# Patient Record
Sex: Female | Born: 1985 | Race: Black or African American | Hispanic: No | Marital: Married | Smoking: Current every day smoker
Health system: Southern US, Community
[De-identification: ages and names within clinical notes are randomized; demographics above are authoritative.]

## PROBLEM LIST (undated history)

## (undated) DIAGNOSIS — D649 Anemia, unspecified: Secondary | ICD-10-CM

## (undated) HISTORY — PX: HERNIA REPAIR: SHX51

---

## 2010-02-15 ENCOUNTER — Emergency Department (HOSPITAL_COMMUNITY): Admission: EM | Admit: 2010-02-15 | Discharge: 2010-02-16 | Payer: Self-pay | Admitting: Emergency Medicine

## 2011-01-30 LAB — URINALYSIS, ROUTINE W REFLEX MICROSCOPIC
Hgb urine dipstick: NEGATIVE
Protein, ur: NEGATIVE mg/dL
Urobilinogen, UA: 1 mg/dL (ref 0.0–1.0)

## 2011-10-16 ENCOUNTER — Encounter: Payer: Self-pay | Admitting: *Deleted

## 2011-10-16 ENCOUNTER — Emergency Department (HOSPITAL_COMMUNITY)
Admission: EM | Admit: 2011-10-16 | Discharge: 2011-10-16 | Disposition: A | Discharge status: Home / Self Care | Payer: Self-pay | Attending: Emergency Medicine | Admitting: Emergency Medicine

## 2011-10-16 DIAGNOSIS — R109 Unspecified abdominal pain: Secondary | ICD-10-CM | POA: Insufficient documentation

## 2011-10-16 DIAGNOSIS — R300 Dysuria: Secondary | ICD-10-CM | POA: Insufficient documentation

## 2011-10-16 DIAGNOSIS — M549 Dorsalgia, unspecified: Secondary | ICD-10-CM | POA: Insufficient documentation

## 2011-10-16 DIAGNOSIS — N390 Urinary tract infection, site not specified: Secondary | ICD-10-CM | POA: Insufficient documentation

## 2011-10-16 DIAGNOSIS — R6883 Chills (without fever): Secondary | ICD-10-CM | POA: Insufficient documentation

## 2011-10-16 LAB — URINALYSIS, ROUTINE W REFLEX MICROSCOPIC
Glucose, UA: NEGATIVE mg/dL
Hgb urine dipstick: NEGATIVE
Specific Gravity, Urine: 1.023 (ref 1.005–1.030)
pH: 7.5 (ref 5.0–8.0)

## 2011-10-16 LAB — POCT PREGNANCY, URINE: Preg Test, Ur: NEGATIVE

## 2011-10-16 LAB — URINE MICROSCOPIC-ADD ON

## 2011-10-16 MED ORDER — CEPHALEXIN 500 MG PO CAPS: 500.0000 mg | ORAL_CAPSULE | Freq: Four times a day (QID) | ORAL | Status: AC

## 2011-10-16 MED ORDER — IBUPROFEN 800 MG PO TABS
800.0000 mg | ORAL_TABLET | Freq: Once | ORAL | Status: AC
  Administered 2011-10-16: 800 mg via ORAL
  Filled 2011-10-16: qty 1

## 2011-10-16 MED ORDER — CEFTRIAXONE SODIUM 1 G IJ SOLR
1.0000 g | Freq: Once | INTRAMUSCULAR | Status: AC
  Administered 2011-10-16: 1 g via INTRAMUSCULAR
  Filled 2011-10-16: qty 10

## 2011-10-16 MED ORDER — HYDROCODONE-ACETAMINOPHEN 5-325 MG PO TABS: 1.0000 | ORAL_TABLET | Freq: Four times a day (QID) | ORAL | Status: AC | PRN

## 2011-10-16 MED ORDER — NAPROXEN 500 MG PO TABS: 500.0000 mg | ORAL_TABLET | Freq: Two times a day (BID) | ORAL | Status: DC

## 2011-10-16 MED ORDER — LIDOCAINE HCL (PF) 1 % IJ SOLN
INTRAMUSCULAR | Status: AC
  Administered 2011-10-16: 5 mL via INTRAMUSCULAR
  Filled 2011-10-16: qty 5

## 2011-10-16 NOTE — ED Notes (Signed)
Also having abd pain this am.

## 2011-10-16 NOTE — ED Provider Notes (Signed)
History     CSN: [id number] Arrival date & time: 10/16/2011  9:26 AM   First MD Initiated Contact with Patient 10/16/11 1124      Chief Complaint  Patient presents with  . Back Pain  . Urinary Tract Infection    (Consider location/radiation/quality/duration/timing/severity/associated sxs/prior treatment) The history is provided by the patient.   Patient is a 25 year old female with complaint of dysuria since Sunday that would be 3 days ago onset of low back pain 2 days ago with chills no nausea no vomiting pain is a 10 out of 10 at worse is nonradiating she also has suprapubic discomfort postvoid. No history of urinary tract infection or back problems in the past. No upper respiratory symptoms.  Pain is described as dull and sharp.     History reviewed. No pertinent past medical history.  History reviewed. No pertinent past surgical history.  History reviewed. No pertinent family history.  History  Substance Use Topics  . Smoking status: Never Smoker   . Smokeless tobacco: Not on file  . Alcohol Use: No    OB History    Grav Para Term Preterm Abortions TAB SAB Ect Mult Living                  Review of Systems  Constitutional: Positive for chills. Negative for fever and diaphoresis.  HENT: Negative for congestion, sore throat and neck pain.   Respiratory: Negative for cough, chest tightness and shortness of breath.   Cardiovascular: Negative for chest pain and leg swelling.  Gastrointestinal: Positive for abdominal pain. Negative for nausea, vomiting and diarrhea.  Genitourinary: Positive for dysuria. Negative for urgency, frequency, hematuria and vaginal discharge.  Musculoskeletal: Positive for back pain.  Skin: Negative for rash.  Neurological: Negative for headaches.  Hematological: Does not bruise/bleed easily.    Allergies  Review of patient's allergies indicates no known allergies.  Home Medications   Current Outpatient Rx  Name Route Sig Dispense  Refill  . NAPROXEN SODIUM 220 MG PO TABS Oral Take 220 mg by mouth 2 (two) times daily as needed. For pain       BP 104/67  Pulse 69  Temp(Src) 98.1 F (36.7 C) (Oral)  Resp 18  SpO2 100%  LMP 10/02/2011  Physical Exam  Nursing note and vitals reviewed. Constitutional: She is oriented to person, place, and time. She appears well-developed and well-nourished. No distress.  HENT:  Head: Normocephalic and atraumatic.  Mouth/Throat: Oropharynx is clear and moist.  Eyes: Conjunctivae and EOM are normal. Pupils are equal, round, and reactive to light.  Neck: Normal range of motion. Neck supple.  Cardiovascular: Normal rate, regular rhythm and normal heart sounds.   No murmur heard. Pulmonary/Chest: Effort normal and breath sounds normal.  Abdominal: Soft. Bowel sounds are normal.  Musculoskeletal: Normal range of motion. She exhibits no edema and no tenderness.  Neurological: She is alert and oriented to person, place, and time. No cranial nerve deficit. She exhibits normal muscle tone. Coordination normal.  Skin: Skin is warm. No rash noted. She is not diaphoretic.    ED Course  Procedures (including critical care time)  Labs Reviewed  URINALYSIS, ROUTINE W REFLEX MICROSCOPIC - Abnormal; Notable for the following:    APPearance CLOUDY (*)    Nitrite POSITIVE (*)    Leukocytes, UA TRACE (*)    All other components within normal limits  URINE MICROSCOPIC-ADD ON - Abnormal; Notable for the following:    Squamous Epithelial / LPF FEW (*)  Bacteria, UA MANY (*)    All other components within normal limits  POCT PREGNANCY, URINE  POCT PREGNANCY, URINE   No results found.   1. Urinary tract infection       MDM   Patient's symptoms consistent with urinary tract infection, urinalysis not is clear but does have positive nitrite, many bacteria questionable contamination but in the face of a positive nitrite we'll treat for urinary tract infection. In the emergency department  patient given of 1 g of IM Rocephin, in case early pyelonephritis due to her back pain and chills. She definitely does give a history of to sure he since Sunday. Suspect at least a component of a urinary tract infect.        Shelda Jakes, MD 10/16/11 [phone]

## 2011-10-16 NOTE — ED Notes (Signed)
Reports shooting pain to mid and lower back pain x 2 days, pain with urination.

## 2011-11-04 ENCOUNTER — Encounter (HOSPITAL_COMMUNITY): Payer: Self-pay | Admitting: *Deleted

## 2011-11-04 ENCOUNTER — Emergency Department (HOSPITAL_COMMUNITY)
Admission: EM | Admit: 2011-11-04 | Discharge: 2011-11-04 | Disposition: A | Discharge status: Home / Self Care | Payer: Self-pay | Attending: Emergency Medicine | Admitting: Emergency Medicine

## 2011-11-04 DIAGNOSIS — R5381 Other malaise: Secondary | ICD-10-CM | POA: Insufficient documentation

## 2011-11-04 DIAGNOSIS — D649 Anemia, unspecified: Secondary | ICD-10-CM | POA: Insufficient documentation

## 2011-11-04 DIAGNOSIS — R319 Hematuria, unspecified: Secondary | ICD-10-CM | POA: Insufficient documentation

## 2011-11-04 DIAGNOSIS — N898 Other specified noninflammatory disorders of vagina: Secondary | ICD-10-CM | POA: Insufficient documentation

## 2011-11-04 DIAGNOSIS — N939 Abnormal uterine and vaginal bleeding, unspecified: Secondary | ICD-10-CM

## 2011-11-04 LAB — BASIC METABOLIC PANEL
CO2: 25 mEq/L (ref 19–32)
Calcium: 9.4 mg/dL (ref 8.4–10.5)
Creatinine, Ser: 0.6 mg/dL (ref 0.50–1.10)
GFR calc non Af Amer: >90 mL/min (ref >90)
Glucose, Bld: 89 mg/dL (ref 70–99)
Sodium: 141 mEq/L (ref 135–145)

## 2011-11-04 LAB — DIFFERENTIAL
Basophils Absolute: 0 10*3/uL (ref 0.0–0.1)
Basophils Relative: 1 % (ref 0–1)
Eosinophils Absolute: 0.1 10*3/uL (ref 0.0–0.7)
Monocytes Absolute: 0.4 10*3/uL (ref 0.1–1.0)
Neutro Abs: 2.6 10*3/uL (ref 1.7–7.7)
Neutrophils Relative %: 47 % (ref 43–77)

## 2011-11-04 LAB — CBC
MCH: 20.2 pg — ABNORMAL LOW (ref 26.0–34.0)
MCHC: 29.5 g/dL — ABNORMAL LOW (ref 30.0–36.0)
RDW: 17.1 % — ABNORMAL HIGH (ref 11.5–15.5)

## 2011-11-04 LAB — URINALYSIS, ROUTINE W REFLEX MICROSCOPIC
Bilirubin Urine: NEGATIVE
Glucose, UA: NEGATIVE mg/dL
Ketones, ur: NEGATIVE mg/dL
Protein, ur: 30 mg/dL — AB
Urobilinogen, UA: 1 mg/dL (ref 0.0–1.0)

## 2011-11-04 LAB — HCG, QUANTITATIVE, PREGNANCY: hCG, Beta Chain, Quant, S: <1 m[IU]/mL (ref <5)

## 2011-11-04 LAB — WET PREP, GENITAL: Clue Cells Wet Prep HPF POC: NONE SEEN

## 2011-11-04 LAB — URINE MICROSCOPIC-ADD ON

## 2011-11-04 LAB — PREGNANCY, URINE: Preg Test, Ur: NEGATIVE

## 2011-11-04 LAB — PROTIME-INR: INR: 1.12 (ref 0.00–1.49)

## 2011-11-04 MED ORDER — FERROUS SULFATE 325 (65 FE) MG PO TABS: 325.0000 mg | ORAL_TABLET | Freq: Every day | ORAL | Status: DC

## 2011-11-04 NOTE — ED Notes (Signed)
To ed for eval of heavy vaginal bleeding. Started yesterday. States she has passed large clots

## 2011-11-04 NOTE — ED Provider Notes (Signed)
History     CSN: [id number]  Arrival date & time 11/04/11  1334   First MD Initiated Contact with Patient 11/04/11 1414      Chief Complaint  Patient presents with  . Vaginal Bleeding    (Consider location/radiation/quality/duration/timing/severity/associated sxs/prior treatment) HPI Comments: Patient presents with vaginal bleeding started last night. She noticed dark red blood with 2 large clots last night. This morning she woke up there was a lot of blood she passed a large clot. Her last normal period was November 20. She is not have any pain with the bleeding prior to the bleeding she had cramps. She denies any nausea, vomiting, chest pain, shortness of breath, lightheadedness. She does endorse denies weakness. She does have a history of anemia and is supposed and iron which is not take.  The history is provided by the patient.    History reviewed. No pertinent past medical history.  History reviewed. No pertinent past surgical history.  History reviewed. No pertinent family history.  History  Substance Use Topics  . Smoking status: Never Smoker   . Smokeless tobacco: Not on file  . Alcohol Use: No    OB History    Grav Para Term Preterm Abortions TAB SAB Ect Mult Living                  Review of Systems  Constitutional: Positive for fatigue.  HENT: Negative for congestion and rhinorrhea.   Respiratory: Negative for cough and shortness of breath.   Cardiovascular: Negative for chest pain.  Gastrointestinal: Negative for nausea, vomiting and abdominal pain.  Genitourinary: Positive for hematuria and vaginal bleeding. Negative for pelvic pain.  Musculoskeletal: Negative for back pain.  Skin: Negative for rash.  Neurological: Positive for weakness. Negative for dizziness and headaches.    Allergies  Review of patient's allergies indicates no known allergies.  Home Medications   Current Outpatient Rx  Name Route Sig Dispense Refill  . FERROUS SULFATE 325  (65 FE) MG PO TABS Oral Take 1 tablet (325 mg total) by mouth daily. 30 tablet 0    BP 114/71  Pulse 89  Temp(Src) 97 F (36.1 C) (Oral)  Resp 17  SpO2 99%  LMP 10/02/2011  Physical Exam  Constitutional: She is oriented to person, place, and time. She appears well-developed and well-nourished. No distress.  HENT:  Head: Normocephalic and atraumatic.  Mouth/Throat: Oropharynx is clear and moist. No oropharyngeal exudate.  Eyes: Conjunctivae are normal. Pupils are equal, round, and reactive to light.  Neck: Normal range of motion. Neck supple.  Cardiovascular: Normal rate, regular rhythm and normal heart sounds.   Pulmonary/Chest: Effort normal and breath sounds normal. No respiratory distress.  Abdominal: Soft. There is no tenderness. There is no rebound and no guarding.  Genitourinary: Vagina normal. There is no tenderness on the right labia. There is no tenderness on the left labia. Cervix exhibits no motion tenderness. Right adnexum displays no tenderness. Left adnexum displays no tenderness.       Dark red blood in vaginal vault, cervix normal with no active bleeding  Musculoskeletal: Normal range of motion. She exhibits no edema and no tenderness.  Neurological: She is alert and oriented to person, place, and time. No cranial nerve deficit.  Skin: Skin is warm.    ED Course  Procedures (including critical care time)  Labs Reviewed  CBC - Abnormal; Notable for the following:    RBC 3.72 (*)    Hemoglobin 7.5 (*)    HCT  25.4 (*)    MCV 68.3 (*)    MCH 20.2 (*)    MCHC 29.5 (*)    RDW 17.1 (*)    All other components within normal limits  URINALYSIS, ROUTINE W REFLEX MICROSCOPIC - Abnormal; Notable for the following:    Hgb urine dipstick LARGE (*)    Protein, ur 30 (*)    All other components within normal limits  WET PREP, GENITAL - Abnormal; Notable for the following:    WBC, Wet Prep HPF POC FEW (*)    All other components within normal limits  URINE  MICROSCOPIC-ADD ON - Abnormal; Notable for the following:    Squamous Epithelial / LPF MANY (*)    Bacteria, UA FEW (*)    All other components within normal limits  DIFFERENTIAL  PROTIME-INR  BASIC METABOLIC PANEL  PREGNANCY, URINE  HCG, QUANTITATIVE, PREGNANCY  GC/CHLAMYDIA PROBE AMP, GENITAL   No results found.   1. Vaginal bleeding   2. Anemia       MDM  Vaginal bleeding with clots, history of anemia. Vital signs stable patient no distress. Abdomen soft. No active bleeding on exam.  Hemoglobin 7.5 without a previous studies available.  Orthostatic vitals negative.  She is in no distress and having no pain.  No active vaginal bleeding, patient is not pregnant.  Hemoglobin is 7.5 without any previous values available.  Patient does not know her baseline.  We'll start patient back on iron and have followup with women's clinic. She is aware of her anemia.       Glynn Octave, MD 11/04/11 1728

## 2011-11-06 LAB — GC/CHLAMYDIA PROBE AMP, GENITAL
Chlamydia, DNA Probe: NEGATIVE
GC Probe Amp, Genital: NEGATIVE

## 2012-03-26 ENCOUNTER — Inpatient Hospital Stay (HOSPITAL_COMMUNITY)
Admission: AD | Admit: 2012-03-26 | Discharge: 2012-03-26 | Disposition: A | Discharge status: Home / Self Care | Payer: Self-pay | Source: Ambulatory Visit | Attending: Obstetrics & Gynecology | Admitting: Obstetrics & Gynecology

## 2012-03-26 ENCOUNTER — Inpatient Hospital Stay (HOSPITAL_COMMUNITY)
Admission: AD | Admit: 2012-03-26 | Discharge: 2012-03-26 | Disposition: A | Discharge status: Home / Self Care | Payer: Self-pay | Source: Ambulatory Visit | Attending: Obstetrics and Gynecology | Admitting: Obstetrics and Gynecology

## 2012-03-26 DIAGNOSIS — R42 Dizziness and giddiness: Secondary | ICD-10-CM | POA: Insufficient documentation

## 2012-03-26 DIAGNOSIS — R51 Headache: Secondary | ICD-10-CM | POA: Insufficient documentation

## 2012-03-26 DIAGNOSIS — Z5320 Procedure and treatment not carried out because of patient's decision for unspecified reasons: Secondary | ICD-10-CM | POA: Insufficient documentation

## 2012-03-26 DIAGNOSIS — D649 Anemia, unspecified: Secondary | ICD-10-CM | POA: Insufficient documentation

## 2012-03-26 LAB — COMPREHENSIVE METABOLIC PANEL
Albumin: 3.5 g/dL (ref 3.5–5.2)
BUN: 7 mg/dL (ref 6–23)
Calcium: 9.2 mg/dL (ref 8.4–10.5)
Creatinine, Ser: 0.59 mg/dL (ref 0.50–1.10)
Total Protein: 6.7 g/dL (ref 6.0–8.3)

## 2012-03-26 LAB — CBC
HCT: 29.3 % — ABNORMAL LOW (ref 36.0–46.0)
MCHC: 28.7 g/dL — ABNORMAL LOW (ref 30.0–36.0)
MCV: 69.9 fL — ABNORMAL LOW (ref 78.0–100.0)
RDW: 17.4 % — ABNORMAL HIGH (ref 11.5–15.5)

## 2012-03-26 LAB — URINALYSIS, ROUTINE W REFLEX MICROSCOPIC
Bilirubin Urine: NEGATIVE
Glucose, UA: NEGATIVE mg/dL
Hgb urine dipstick: NEGATIVE
Specific Gravity, Urine: 1.01 (ref 1.005–1.030)
Urobilinogen, UA: 2 mg/dL — ABNORMAL HIGH (ref 0.0–1.0)
pH: 7.5 (ref 5.0–8.0)

## 2012-03-26 LAB — POCT PREGNANCY, URINE: Preg Test, Ur: NEGATIVE

## 2012-03-26 MED ORDER — FERROUS SULFATE 325 (65 FE) MG PO TABS: 325.0000 mg | ORAL_TABLET | Freq: Three times a day (TID) | ORAL | Status: DC

## 2012-03-26 MED ORDER — IBUPROFEN 800 MG PO TABS: 800.0000 mg | ORAL_TABLET | Freq: Three times a day (TID) | ORAL | Status: AC | PRN

## 2012-03-26 NOTE — MAU Note (Signed)
Patient presents with c/o headache and dizziness onset  One week, LMP 5 days ago.

## 2012-03-26 NOTE — MAU Note (Signed)
Patient states she is anemic not taking iron last time her Hbg checked was 7

## 2012-03-26 NOTE — MAU Note (Signed)
Not in lobby

## 2012-03-26 NOTE — MAU Note (Signed)
Patient returns after leaving MAU earlier today without further evaluation.

## 2012-03-26 NOTE — MAU Provider Note (Signed)
History     CSN: [id number]  Arrival date and time: 03/26/12 5638   First Provider Initiated Contact with Patient 03/26/12 1844      Chief Complaint  Patient presents with  . Dizziness  . Headache   HPI 26 y.o. female c/o ongoing dizziness, weakness, headache. Headache only minimally relieved with tylenol, has not tried ibuprofen. Reports irregular bleeding/intermenstrual spotting, no current vaginal bleeding or pain. BTL for birth control. Was seen in MAU in December for dysfunctional bleeding, Hgb 7 at that time, was prescribed iron but has not been taking. Note from that visit indicates that she was to follow up in GYN clinic, but pt has not had follow up and she states she was not aware that she was supposed to follow up.    No past medical history on file.  No past surgical history on file.  No family history on file.  History  Substance Use Topics  . Smoking status: Never Smoker   . Smokeless tobacco: Not on file  . Alcohol Use: No    Allergies: No Known Allergies  No prescriptions prior to admission    Review of Systems  Eyes: Negative for blurred vision.  Respiratory: Negative.  Negative for shortness of breath.   Cardiovascular: Negative.  Negative for chest pain and palpitations.  Gastrointestinal: Negative for nausea, vomiting, abdominal pain, diarrhea and constipation.  Genitourinary: Negative for dysuria, urgency, frequency, hematuria and flank pain.       Negative for vaginal bleeding, vaginal discharge, dyspareunia  Musculoskeletal: Negative.   Neurological: Positive for dizziness, weakness and headaches. Negative for sensory change, speech change, seizures and loss of consciousness.  Psychiatric/Behavioral: Negative.    Physical Exam   Blood pressure 114/64, pulse 65, temperature 97.2 F (36.2 C), temperature source Oral, resp. rate 16, last menstrual period 03/20/2012.  Physical Exam  Nursing note and vitals reviewed. Constitutional: She is  oriented to person, place, and time. She appears well-developed and well-nourished. No distress.  Cardiovascular: Normal rate and regular rhythm.   Respiratory: Effort normal. No respiratory distress.  Musculoskeletal: Normal range of motion.  Neurological: She is alert and oriented to person, place, and time.  Skin: Skin is warm and dry.  Psychiatric: She has a normal mood and affect.    MAU Course  Procedures Results for orders placed during the hospital encounter of 03/26/12 (from the past 24 hour(s))  URINALYSIS, ROUTINE W REFLEX MICROSCOPIC     Status: Abnormal   Collection Time   03/26/12 11:25 AM      Component Value Range   Color, Urine YELLOW  YELLOW    APPearance CLEAR  CLEAR    Specific Gravity, Urine 1.010  1.005 - 1.030    pH 7.5  5.0 - 8.0    Glucose, UA NEGATIVE  NEGATIVE (mg/dL)   Hgb urine dipstick NEGATIVE  NEGATIVE    Bilirubin Urine NEGATIVE  NEGATIVE    Ketones, ur NEGATIVE  NEGATIVE (mg/dL)   Protein, ur NEGATIVE  NEGATIVE (mg/dL)   Urobilinogen, UA 2.0 (*) 0.0 - 1.0 (mg/dL)   Nitrite NEGATIVE  NEGATIVE    Leukocytes, UA NEGATIVE  NEGATIVE   CBC     Status: Abnormal   Collection Time   03/26/12 12:40 PM      Component Value Range   WBC 5.1  4.0 - 10.5 (K/uL)   RBC 4.19  3.87 - 5.11 (MIL/uL)   Hemoglobin 8.4 (*) 12.0 - 15.0 (g/dL)   HCT 75.6 (*) 43.3 - 46.0 (%)  MCV 69.9 (*) 78.0 - 100.0 (fL)   MCH 20.0 (*) 26.0 - 34.0 (pg)   MCHC 28.7 (*) 30.0 - 36.0 (g/dL)   RDW 16.1 (*) 09.6 - 15.5 (%)   Platelets 401 (*) 150 - 400 (K/uL)  COMPREHENSIVE METABOLIC PANEL     Status: Normal   Collection Time   03/26/12 12:40 PM      Component Value Range   Sodium 136  135 - 145 (mEq/L)   Potassium 3.9  3.5 - 5.1 (mEq/L)   Chloride 103  96 - 112 (mEq/L)   CO2 26  19 - 32 (mEq/L)   Glucose, Bld 88  70 - 99 (mg/dL)   BUN 7  6 - 23 (mg/dL)   Creatinine, Ser 0.45  0.50 - 1.10 (mg/dL)   Calcium 9.2  8.4 - 40.9 (mg/dL)   Total Protein 6.7  6.0 - 8.3 (g/dL)   Albumin  3.5  3.5 - 5.2 (g/dL)   AST 21  0 - 37 (U/L)   ALT 12  0 - 35 (U/L)   Alkaline Phosphatase 78  39 - 117 (U/L)   Total Bilirubin 0.6  0.3 - 1.2 (mg/dL)   GFR calc non Af Amer >90  >90 (mL/min)   GFR calc Af Amer >90  >90 (mL/min)  POCT PREGNANCY, URINE     Status: Normal   Collection Time   03/26/12 12:46 PM      Component Value Range   Preg Test, Ur NEGATIVE  NEGATIVE      Assessment and Plan  26 y.o. female with chronic anemia, stable, hemoglobin slightly increased from last check - non-compliant with iron supplement Rx Motrin 800 mg tid for headaches Discussed that symptoms could all be contributed to anemia, important to take iron supplement Pt reports ongoing DUB, no current bleeding - request sent to GYN clinic for follow up appointment  Newt Levingston 03/26/2012, 7:03 PM

## 2012-03-26 NOTE — MAU Provider Note (Signed)
History     CSN: [id number]  Arrival date & time 03/26/12  1103   None     Chief Complaint  Patient presents with  . Dizziness  . Headache    (Consider location/radiation/quality/duration/timing/severity/associated sxs/prior treatment) HPI Called pt in lobby and no response. No past medical history on file.  No past surgical history on file.  No family history on file.  History  Substance Use Topics  . Smoking status: Never Smoker   . Smokeless tobacco: Not on file  . Alcohol Use: No    OB History    Grav Para Term Preterm Abortions TAB SAB Ect Mult Living                  Review of Systems  Allergies  Review of patient's allergies indicates no known allergies.  Home Medications  No current outpatient prescriptions on file.  BP 110/83  Pulse 74  Temp(Src) 97.2 F (36.2 C) (Oral)  Resp 16  Ht 5\' 4"  (1.626 m)  Wt 213 lb 6.4 oz (96.798 kg)  BMI 36.63 kg/m2  LMP 03/20/2012  Physical Exam  ED Course  Procedures (including critical care time)  Labs Reviewed  URINALYSIS, ROUTINE W REFLEX MICROSCOPIC - Abnormal; Notable for the following:    Urobilinogen, UA 2.0 (*)    All other components within normal limits  CBC - Abnormal; Notable for the following:    Hemoglobin 8.4 (*)    HCT 29.3 (*)    MCV 69.9 (*)    MCH 20.0 (*)    MCHC 28.7 (*)    RDW 17.4 (*)    Platelets 401 (*)    All other components within normal limits  COMPREHENSIVE METABOLIC PANEL  POCT PREGNANCY, URINE   No results found.   No diagnosis found.    MDM  Pt left before being seen

## 2012-03-27 NOTE — MAU Provider Note (Signed)
Agree with above note.  Katie Ross 03/27/2012 9:06 AM

## 2012-04-23 ENCOUNTER — Encounter: Payer: Self-pay | Admitting: Advanced Practice Midwife

## 2012-07-02 ENCOUNTER — Emergency Department (HOSPITAL_COMMUNITY): Payer: Self-pay

## 2012-07-02 ENCOUNTER — Encounter (HOSPITAL_COMMUNITY): Payer: Self-pay | Admitting: Emergency Medicine

## 2012-07-02 ENCOUNTER — Emergency Department (HOSPITAL_COMMUNITY)
Admission: EM | Admit: 2012-07-02 | Discharge: 2012-07-02 | Disposition: A | Discharge status: Home / Self Care | Payer: Self-pay | Attending: Emergency Medicine | Admitting: Emergency Medicine

## 2012-07-02 DIAGNOSIS — R079 Chest pain, unspecified: Secondary | ICD-10-CM | POA: Insufficient documentation

## 2012-07-02 LAB — BASIC METABOLIC PANEL
BUN: 8 mg/dL (ref 6–23)
Chloride: 105 mEq/L (ref 96–112)
GFR calc Af Amer: >90 mL/min (ref >90)
Potassium: 4.2 mEq/L (ref 3.5–5.1)

## 2012-07-02 LAB — CBC
HCT: 28.7 % — ABNORMAL LOW (ref 36.0–46.0)
Hemoglobin: 8.8 g/dL — ABNORMAL LOW (ref 12.0–15.0)
RDW: 16.3 % — ABNORMAL HIGH (ref 11.5–15.5)
WBC: 4.3 10*3/uL (ref 4.0–10.5)

## 2012-07-02 LAB — POCT I-STAT TROPONIN I

## 2012-07-02 MED ORDER — IBUPROFEN 200 MG PO TABS
600.0000 mg | ORAL_TABLET | Freq: Once | ORAL | Status: AC
  Administered 2012-07-02: 600 mg via ORAL
  Filled 2012-07-02: qty 3

## 2012-07-02 NOTE — ED Notes (Signed)
Dr. Rulon Abide is at bedside.

## 2012-07-02 NOTE — ED Notes (Signed)
Patient stated that the chest pain started 2 weeks ago getting progressively worse. Patient also stated that she has been coughing but denies any fever. Pt claimed that the chest pain is worse with deep breathing. Patient also complaints of shortness of breath.

## 2012-07-02 NOTE — ED Provider Notes (Signed)
History     CSN: [id number]  Arrival date & time 07/02/12  4098   First MD Initiated Contact with Patient 07/02/12 1129      Chief Complaint  Patient presents with  . Chest Pain    (Consider location/radiation/quality/duration/timing/severity/associated sxs/prior treatment) Patient is a 26 y.o. female presenting with chest pain. The history is provided by the patient.  Chest Pain   Katie Ross is a 26 y.o. female who presents with a history of 2 weeks constant chest pain is described as pressure and located in the center of her chest. Patient has no prior history of DVT, PE, heart disease, or elevated blood pressure. Patient has a history of chronic anemia. Pain does not radiate is not associated with shortness of breath, diaphoresis, nausea or vomiting. Patient has been under stress recently as she has recently become homeless and over 2 weeks ago her cousin living with her cousin whose house has mold and she thinks that has something to do with it. There are no other associated symptoms, patient has not taken anything for it. Patient does not have a primary care physician. Patient has no other medical problems and does not take any medicines. Patient is supposed to be taking iron but says she forgets after taking it for about a week and does not like to take medicine anyway.  History reviewed. No pertinent past medical history.  History reviewed. No pertinent past surgical history.  History reviewed. No pertinent family history.  History  Substance Use Topics  . Smoking status: Never Smoker   . Smokeless tobacco: Not on file  . Alcohol Use: No    OB History    Grav Para Term Preterm Abortions TAB SAB Ect Mult Living                  Review of Systems  Cardiovascular: Positive for chest pain.   GENERAL: No fevers or chills, weight loss. HEENT: No change in vision, sore throat or sinus congestion. NECK: No pain or stiffness. CARDIOVASCULAR: + chest pain, no  palpitations, syncope. PULMONARY: No shortness of breath, cough or wheeze. GASTROINTESTINAL: No abdominal pain, nausea, vomiting or diarrhea, melena or bright red blood per rectum. GENITOURINARY: No urinary frequency, urgency, hesitancy or dysuria. MUSCULOSKELETAL: No joint or muscle pain, no back pain, no recent trauma. DERMATOLOGIC: No rash, no itching, no lesions. HEMATOLOGICAL: h/o/  Anemia, No easy bruising or bleeding. NEUROLOGIC: No headache, seizures, numbness, tingling or weakness.  Allergies  Review of patient's allergies indicates no known allergies.  Home Medications  No current outpatient prescriptions on file.  BP 103/76  Pulse 59  Temp 97.6 F (36.4 C) (Oral)  Resp 18  SpO2 100%  LMP 05/18/2012  Physical Exam  Constitutional: She is oriented to person, place, and time and well-developed, well-nourished, and in no distress. No distress.  HENT:  Head: Normocephalic and atraumatic.  Right Ear: External ear normal.  Left Ear: External ear normal.  Nose: Nose normal.  Mouth/Throat: Oropharynx is clear and moist. No oropharyngeal exudate.  Eyes: Conjunctivae and EOM are normal. Pupils are equal, round, and reactive to light. Right eye exhibits no discharge. Left eye exhibits no discharge.  Neck: Normal range of motion. Neck supple. No thyromegaly present.  Cardiovascular: Regular rhythm and normal heart sounds.  Bradycardia present.   Pulmonary/Chest: Effort normal and breath sounds normal. No respiratory distress. She has no wheezes. She has no rales.  Abdominal: Soft. Bowel sounds are normal. She exhibits no distension. There is  no tenderness. There is no rebound.    Musculoskeletal: Normal range of motion.  Lymphadenopathy:    She has no cervical adenopathy.  Neurological: She is alert and oriented to person, place, and time.  Skin: Skin is warm and dry. She is not diaphoretic.  Psychiatric: Mood and affect normal.   Physical Exam  Constitutional: She is oriented to  person, place, and time and well-developed, well-nourished, and in no distress. No distress.  HENT:  Head: Normocephalic and atraumatic.  Right Ear: External ear normal.  Left Ear: External ear normal.  Nose: Nose normal.  Mouth/Throat: Oropharynx is clear and moist. No oropharyngeal exudate.  Eyes: Conjunctivae and EOM are normal. Pupils are equal, round, and reactive to light. Right eye exhibits no discharge. Left eye exhibits no discharge.  Neck: Normal range of motion. Neck supple. No thyromegaly present.  Cardiovascular: Regular rhythm and normal heart sounds.  Bradycardia present.   Pulmonary/Chest: Effort normal and breath sounds normal. No respiratory distress. She has no wheezes. She has no rales.  Abdominal: Soft. Bowel sounds are normal. She exhibits no distension. There is no tenderness. There is no rebound.    Musculoskeletal: Normal range of motion.  Lymphadenopathy:    She has no cervical adenopathy.  Neurological: She is alert and oriented to person, place, and time.  Skin: Skin is warm and dry. She is not diaphoretic.  Psychiatric: Mood and affect normal.   ED Course  Procedures (including critical care time)  Labs Reviewed  CBC - Abnormal; Notable for the following:    Hemoglobin 8.8 (*)     HCT 28.7 (*)     MCV 68.5 (*)     MCH 21.0 (*)     RDW 16.3 (*)     All other components within normal limits  BASIC METABOLIC PANEL  POCT PREGNANCY, URINE  POCT I-STAT TROPONIN I   Dg Chest 2 View  07/02/2012  *RADIOLOGY REPORT*  Clinical Data: Chest pressure  CHEST - 2 VIEW  Comparison: 04/27/2010  Findings: The heart size and mediastinal contours are within normal limits.  Both lungs are clear.  The visualized skeletal structures are unremarkable.  IMPRESSION: Negative exam.   Original Report Authenticated By: Rosealee Albee, M.D.      1. Chest pain       MDM  Katie Ross is a 26 y.o. female and 2 weeks history of chest wall pain her pain is reproducible  on my physical exam, she does state is somewhat different when she has the chest pressure. Protocol labs with the exception of CBC are within normal limits, negative troponin. Patient does have a hemoglobin of 8.8, anemia is chronic, she denies any shortness of breath, dyspnea on exertion or active bleeding emesis this is a well-known problem and she just does not want to take iron.  Chest x-ray is also within normal limits. EKG shows a sinus bradycardia 52 beats per minute, normal axis, normal intervals, no ST or T wave abnormalities suggestive of ischemia or infarction.   Patient's get a negative workup for any cardiac problems, she is PE are seen negative, and I think she's got any emergent intrathoracic or intra-abdominal problems at this time and can safely be discharged home with ibuprofen or Tylenol as needed for chest discomfort. Also advised her to move out of the home the contains mold and to restart taking her iron supplements.  I explained the diagnosis in detail and have given standard ER return precautions including chest pain, shortness  of breath, or any worsening conditions. The patient understands and accepts the medical plan as it's been dictated and I have answered all questions. Discharge instructions concerning home care and prescriptions have been given.  The patient is STABLE and is discharged to home in good condition.  Resources for followup provided.       Jones Skene, MD 07/02/12 1237

## 2012-07-02 NOTE — ED Notes (Signed)
Pt for discharge.Vital signs stable and GCS 15.Pt complains that pt is in chest pain 10/10 .Pt texting and using phone and talking to friend and walking about as she complains of pain on discharge.

## 2012-07-02 NOTE — ED Notes (Signed)
Pt presented to ED with chest pain.Complains of productive cough.At present is pain free.Pt is sitting up in bed and texting.

## 2012-07-02 NOTE — ED Notes (Signed)
Pt c/o mid sternal CP with SOB worse with palpation and inspiration x 2 weeks

## 2013-03-10 ENCOUNTER — Encounter (HOSPITAL_COMMUNITY): Payer: Self-pay

## 2013-03-10 ENCOUNTER — Emergency Department (HOSPITAL_COMMUNITY)
Admission: EM | Admit: 2013-03-10 | Discharge: 2013-03-10 | Disposition: A | Discharge status: Home / Self Care | Payer: Self-pay | Attending: Emergency Medicine | Admitting: Emergency Medicine

## 2013-03-10 DIAGNOSIS — D649 Anemia, unspecified: Secondary | ICD-10-CM | POA: Insufficient documentation

## 2013-03-10 DIAGNOSIS — R350 Frequency of micturition: Secondary | ICD-10-CM | POA: Insufficient documentation

## 2013-03-10 DIAGNOSIS — N949 Unspecified condition associated with female genital organs and menstrual cycle: Secondary | ICD-10-CM | POA: Insufficient documentation

## 2013-03-10 DIAGNOSIS — N390 Urinary tract infection, site not specified: Secondary | ICD-10-CM | POA: Insufficient documentation

## 2013-03-10 DIAGNOSIS — Z9889 Other specified postprocedural states: Secondary | ICD-10-CM | POA: Insufficient documentation

## 2013-03-10 DIAGNOSIS — R5381 Other malaise: Secondary | ICD-10-CM | POA: Insufficient documentation

## 2013-03-10 DIAGNOSIS — R109 Unspecified abdominal pain: Secondary | ICD-10-CM | POA: Insufficient documentation

## 2013-03-10 DIAGNOSIS — R5383 Other fatigue: Secondary | ICD-10-CM | POA: Insufficient documentation

## 2013-03-10 DIAGNOSIS — Z3202 Encounter for pregnancy test, result negative: Secondary | ICD-10-CM | POA: Insufficient documentation

## 2013-03-10 DIAGNOSIS — R1110 Vomiting, unspecified: Secondary | ICD-10-CM | POA: Insufficient documentation

## 2013-03-10 HISTORY — DX: Anemia, unspecified: D64.9

## 2013-03-10 LAB — POCT I-STAT, CHEM 8
Chloride: 107 mEq/L (ref 96–112)
HCT: 30 % — ABNORMAL LOW (ref 36.0–46.0)
Potassium: 4.3 mEq/L (ref 3.5–5.1)

## 2013-03-10 LAB — CBC WITH DIFFERENTIAL/PLATELET
Basophils Absolute: 0 10*3/uL (ref 0.0–0.1)
Eosinophils Absolute: 0.1 10*3/uL (ref 0.0–0.7)
Hemoglobin: 8.9 g/dL — ABNORMAL LOW (ref 12.0–15.0)
Lymphocytes Relative: 37 % (ref 12–46)
MCHC: 30.5 g/dL (ref 30.0–36.0)
Neutrophils Relative %: 50 % (ref 43–77)
Platelets: 357 10*3/uL (ref 150–400)
RDW: 17.8 % — ABNORMAL HIGH (ref 11.5–15.5)

## 2013-03-10 LAB — URINALYSIS, ROUTINE W REFLEX MICROSCOPIC
Ketones, ur: NEGATIVE mg/dL
Nitrite: NEGATIVE
Protein, ur: NEGATIVE mg/dL
pH: 6.5 (ref 5.0–8.0)

## 2013-03-10 LAB — URINE MICROSCOPIC-ADD ON

## 2013-03-10 MED ORDER — NITROFURANTOIN MONOHYD MACRO 100 MG PO CAPS: 100.0000 mg | ORAL_CAPSULE | Freq: Two times a day (BID) | ORAL | Status: DC

## 2013-03-10 MED ORDER — PHENAZOPYRIDINE HCL 200 MG PO TABS: 200.0000 mg | ORAL_TABLET | Freq: Three times a day (TID) | ORAL | Status: DC

## 2013-03-10 NOTE — ED Notes (Signed)
Pt c/o uti sx and general body aches. Pt states sx started 1 week ago. C/o burning with urination, urinary frequency. Denies flank pain. Pt states "I just feel really weak and tired." Denies fever/chills. Denies hematuria. Pt states she vomited 3x.

## 2013-03-10 NOTE — ED Provider Notes (Signed)
History     CSN: [id number]  Arrival date & time 03/10/13  1214   First MD Initiated Contact with Patient 03/10/13 1217      Chief Complaint  Patient presents with  . Dysuria    (Consider location/radiation/quality/duration/timing/severity/associated sxs/prior treatment) HPI Comments: 27 y.o. Female with PMHx of anemia presents complaining of suprapubic pain and burning with urination gradually worsening for the past week. Pt states pain is severe, described as sharp when she is urinating, a dull ache the rest of time. Pain is constant. Pt has tried no interventions. Admits unprotected sex with partner this weekend and vomiting three times yesterday and today. Denies vaginal discharge/bleeding, hematuria, flank pain, nausea, vomiting, fever, back pain.   Pt states she is supposed to take iron pills for her anemia, but she does not. States her hemaglobin has been as low as 7.   LMP 02/21/13    Patient is a 27 y.o. female presenting with dysuria.  Dysuria  Associated symptoms include vomiting and frequency. Pertinent negatives include no nausea, no hematuria, no urgency and no flank pain.    Past Medical History  Diagnosis Date  . Anemia     Past Surgical History  Procedure Laterality Date  . Cesarean section      x3  . Hernia repair      History reviewed. No pertinent family history.  History  Substance Use Topics  . Smoking status: Never Smoker   . Smokeless tobacco: Not on file  . Alcohol Use: Yes     Comment: occ    OB History   Grav Para Term Preterm Abortions TAB SAB Ect Mult Living                  Review of Systems  Constitutional: Negative for fever and diaphoresis.  HENT: Negative for neck pain and neck stiffness.   Eyes: Negative for visual disturbance.  Respiratory: Negative for chest tightness and shortness of breath.   Cardiovascular: Negative for chest pain and palpitations.  Gastrointestinal: Positive for vomiting and abdominal pain. Negative  for nausea, diarrhea and constipation.       Suprapubic pain  Genitourinary: Positive for dysuria, frequency and pelvic pain. Negative for urgency, hematuria, flank pain, vaginal bleeding, vaginal discharge, difficulty urinating and vaginal pain.  Musculoskeletal: Negative for back pain and gait problem.  Skin: Negative for rash.  Neurological: Positive for weakness. Negative for dizziness, light-headedness, numbness and headaches.    Allergies  Review of patient's allergies indicates no known allergies.  Home Medications  No current outpatient prescriptions on file.  BP 125/76  Pulse 65  Temp(Src) 98.6 F (37 C) (Oral)  Resp 16  SpO2 100%  LMP 02/21/2013  Physical Exam  Nursing note and vitals reviewed. Constitutional: She is oriented to person, place, and time. She appears well-developed and well-nourished. No distress.  HENT:  Head: Normocephalic and atraumatic.  Eyes: Conjunctivae and EOM are normal.  Neck: Normal range of motion. Neck supple.  No meningeal signs  Cardiovascular: Normal rate, regular rhythm and normal heart sounds.  Exam reveals no gallop and no friction rub.   No murmur heard. Pulmonary/Chest: Effort normal and breath sounds normal. No respiratory distress. She has no wheezes. She has no rales. She exhibits no tenderness.  Abdominal: Soft. Bowel sounds are normal. She exhibits no distension. There is no tenderness. There is no rebound and no guarding.    Genitourinary: Vagina normal. No vaginal discharge found.  White cervical discharge, positive CMT, no external  lesions, no adnexal tenderness, chaperone present  Musculoskeletal: Normal range of motion. She exhibits no edema and no tenderness.  Normal strength in upper and lower extremities bilaterally including dorsiflexion and plantar flexion, strong and equal grip strength  Neurological: She is alert and oriented to person, place, and time. No cranial nerve deficit.  Speech is clear and goal oriented,  follows commands Sensation normal to light touch  Moves extremities without ataxia, coordination intact Normal gait and balance  Skin: Skin is warm and dry. She is not diaphoretic. No erythema.  Psychiatric: She has a normal mood and affect.    ED Course  Procedures (including critical care time)  Labs Reviewed  WET PREP, GENITAL - Abnormal; Notable for the following:    WBC, Wet Prep HPF POC FEW (*)    All other components within normal limits  URINALYSIS, ROUTINE W REFLEX MICROSCOPIC - Abnormal; Notable for the following:    APPearance CLOUDY (*)    Hgb urine dipstick SMALL (*)    Leukocytes, UA LARGE (*)    All other components within normal limits  CBC WITH DIFFERENTIAL - Abnormal; Notable for the following:    Hemoglobin 8.9 (*)    HCT 29.2 (*)    MCV 67.1 (*)    MCH 20.5 (*)    RDW 17.8 (*)    All other components within normal limits  URINE MICROSCOPIC-ADD ON - Abnormal; Notable for the following:    Bacteria, UA FEW (*)    All other components within normal limits  POCT I-STAT, CHEM 8 - Abnormal; Notable for the following:    BUN 5 (*)    Calcium, Ion 1.26 (*)    Hemoglobin 10.2 (*)    HCT 30.0 (*)    All other components within normal limits  GC/CHLAMYDIA PROBE AMP  URINE CULTURE  POCT PREGNANCY, URINE   No results found.   1. Urinary tract infection       MDM  Pt presents with sx consistent with UTI  .Pt is afebrile, no CVA tenderness, normotensive.  Pelvic exam reveals CMT and moderate amount of white cervical discharge. Will wait for urinalysis and wet prep to return. I-stat to check H/H.   H/H 8.9. Discussed with pt importance of taking iron pills. Also discussed importance of being followed by a primary care doctor. Will provide resource guide and information on Affordable Care Act.   Pt to be dc home with antibiotics and instructions to establish relationship with with PCP. Return precautions understood and pt is in agreement with discharge plan.    Glade Nurse, PA-C 03/10/13 1428

## 2013-03-11 LAB — GC/CHLAMYDIA PROBE AMP
CT Probe RNA: NEGATIVE
GC Probe RNA: NEGATIVE

## 2013-03-11 NOTE — ED Provider Notes (Signed)
Medical screening examination/treatment/procedure(s) were performed by non-physician practitioner and as supervising physician I was immediately available for consultation/collaboration.  Layan Zalenski J. Kara Mierzejewski, MD 03/11/13 1107 

## 2013-03-13 LAB — URINE CULTURE: Colony Count: >=100000

## 2013-03-14 ENCOUNTER — Telehealth (HOSPITAL_COMMUNITY): Payer: Self-pay | Admitting: Emergency Medicine

## 2013-03-14 NOTE — ED Notes (Signed)
Patient has +Urine culture. °

## 2013-03-14 NOTE — ED Notes (Signed)
+  Urine. Patient treated with Macrobid. Sensitive to same. Per protocol MD. °

## 2013-03-28 ENCOUNTER — Encounter (HOSPITAL_COMMUNITY): Payer: Self-pay | Admitting: Adult Health

## 2013-03-28 ENCOUNTER — Emergency Department (HOSPITAL_COMMUNITY)
Admission: EM | Admit: 2013-03-28 | Discharge: 2013-03-28 | Disposition: A | Discharge status: Home / Self Care | Payer: Self-pay | Attending: Emergency Medicine | Admitting: Emergency Medicine

## 2013-03-28 DIAGNOSIS — R509 Fever, unspecified: Secondary | ICD-10-CM | POA: Insufficient documentation

## 2013-03-28 DIAGNOSIS — K047 Periapical abscess without sinus: Secondary | ICD-10-CM | POA: Insufficient documentation

## 2013-03-28 DIAGNOSIS — H9209 Otalgia, unspecified ear: Secondary | ICD-10-CM | POA: Insufficient documentation

## 2013-03-28 DIAGNOSIS — D649 Anemia, unspecified: Secondary | ICD-10-CM | POA: Insufficient documentation

## 2013-03-28 MED ORDER — IBUPROFEN 400 MG PO TABS
800.0000 mg | ORAL_TABLET | Freq: Once | ORAL | Status: AC
  Administered 2013-03-28: 800 mg via ORAL
  Filled 2013-03-28: qty 2

## 2013-03-28 MED ORDER — OXYCODONE-ACETAMINOPHEN 5-325 MG PO TABS
1.0000 | ORAL_TABLET | Freq: Once | ORAL | Status: AC
  Administered 2013-03-28: 1 via ORAL
  Filled 2013-03-28: qty 1

## 2013-03-28 MED ORDER — CLINDAMYCIN PHOSPHATE 900 MG/50ML IV SOLN
900.0000 mg | Freq: Once | INTRAVENOUS | Status: AC
  Administered 2013-03-28: 900 mg via INTRAVENOUS
  Filled 2013-03-28: qty 50

## 2013-03-28 MED ORDER — AMOXICILLIN 500 MG PO CAPS
500.0000 mg | ORAL_CAPSULE | Freq: Once | ORAL | Status: DC
  Filled 2013-03-28: qty 1

## 2013-03-28 MED ORDER — OXYCODONE-ACETAMINOPHEN 5-325 MG PO TABS: 1.0000 | ORAL_TABLET | ORAL | Status: DC | PRN

## 2013-03-28 MED ORDER — CLINDAMYCIN HCL 150 MG PO CAPS: 300.0000 mg | ORAL_CAPSULE | Freq: Three times a day (TID) | ORAL | Status: DC

## 2013-03-28 MED ORDER — ACETAMINOPHEN 325 MG PO TABS
650.0000 mg | ORAL_TABLET | Freq: Once | ORAL | Status: AC
  Administered 2013-03-28: 650 mg via ORAL
  Filled 2013-03-28: qty 2

## 2013-03-28 MED ORDER — IBUPROFEN 800 MG PO TABS: 800.0000 mg | ORAL_TABLET | Freq: Three times a day (TID) | ORAL | Status: DC

## 2013-03-28 NOTE — ED Notes (Signed)
Pt discharged.Vital signs stable and GCS 15 

## 2013-03-28 NOTE — ED Provider Notes (Signed)
History    This chart was scribed for Jaynie Crumble, non-physician practitioner working with Carleene Cooper III, MD by Leone Payor, ED Scribe. This patient was seen in room TR09C/TR09C and the patient's care was started at 2026.   CSN: [id number]  Arrival date & time 03/28/13  2026   First MD Initiated Contact with Patient 03/28/13 2115      Chief Complaint  Patient presents with  . Dental Pain     The history is provided by the patient. No language interpreter was used.    HPI Comments: Katie Ross is a 27 y.o. female who presents to the Emergency Department complaining of ongoing, constant, gradually worsening, R sided lower tooth pain and R ear pain starting 1 week ago. She has an associated fever. Pt has taken ibuprofen, tylenol, aleve with no relief. Pt is an occasional alcohol user but denies smoking.    Past Medical History  Diagnosis Date  . Anemia     Past Surgical History  Procedure Laterality Date  . Cesarean section      x3  . Hernia repair      History reviewed. No pertinent family history.  History  Substance Use Topics  . Smoking status: Never Smoker   . Smokeless tobacco: Not on file  . Alcohol Use: Yes     Comment: occ    OB History   Grav Para Term Preterm Abortions TAB SAB Ect Mult Living                  Review of Systems  Constitutional: Positive for fever.  HENT: Positive for facial swelling and dental problem.     Allergies  Review of patient's allergies indicates no known allergies.  Home Medications   Current Outpatient Rx  Name  Route  Sig  Dispense  Refill  . acetaminophen (TYLENOL) 500 MG tablet   Oral   Take 1,000 mg by mouth every 6 (six) hours as needed for pain.         Marland Kitchen ibuprofen (ADVIL,MOTRIN) 200 MG tablet   Oral   Take 400 mg by mouth every 6 (six) hours as needed for pain.         . naproxen sodium (ANAPROX) 220 MG tablet   Oral   Take 440 mg by mouth 2 (two) times daily as needed (for pain).            BP 122/71  Pulse 78  Temp(Src) 101.5 F (38.6 C) (Oral)  Resp 18  SpO2 100%  LMP 02/21/2013  Physical Exam  Nursing note and vitals reviewed. Constitutional: She is oriented to person, place, and time. She appears well-developed and well-nourished. No distress.  HENT:  Head: Normocephalic and atraumatic.  Right Ear: External ear normal.  Left Ear: External ear normal.  Mouth/Throat: Oropharynx is clear and moist.  TMs nromal bilaterally. Right lower 1st molar dental cavity. Surrounding swelling and abscess. Tender to palpation.  No swelling under the tongue. Pharynx normal. There is facial right mandibular swelling and tenderness.   Eyes: Conjunctivae and EOM are normal.  Neck: Normal range of motion. Neck supple. No tracheal deviation present.  Cardiovascular: Normal rate.   Pulmonary/Chest: Effort normal. No respiratory distress.  Musculoskeletal: Normal range of motion.  Neurological: She is alert and oriented to person, place, and time.  Skin: Skin is warm and dry.  Psychiatric: She has a normal mood and affect. Her behavior is normal.    ED Course  Procedures (including critical care  time)  DIAGNOSTIC STUDIES: Oxygen Saturation is 100% on room air, normal by my interpretation.    COORDINATION OF CARE: 9:37 PM Discussed treatment plan with pt at bedside and pt agreed to plan.   Labs Reviewed - No data to display No results found.   1. Dental abscess       MDM  Pt with right dental abscess, facial swelling, fever of 101.5. Treated fever with ibuprofen and percocet in ED. IV clindamycin given for infection. Other than fever, VS are normal, she does not appear septic. No ludwigs angina, no swelling under the tongue. Plan to d/c home with clindamycin PO, pain medications, dental referral. Instructed to return if unable to follow up and symptoms are worsening.   Filed Vitals:   03/28/13 2035 03/28/13 2153 03/28/13 2238 03/28/13 2318  BP: 122/71 121/70   110/60  Pulse: 78 76 78 84  Temp: 101.5 F (38.6 C) 102 F (38.9 C)  100.1 F (37.8 C)  TempSrc: Oral Oral    Resp: 18  16 18   SpO2: 100%  98% 98%    I personally performed the services described in this documentation, which was scribed in my presence. The recorded information has been reviewed and is accurate.   Lottie Mussel, PA-C 03/29/13 [phone]

## 2013-03-28 NOTE — ED Notes (Signed)
Presents with right sided lower tooth pain and right ear pain that began one week ago, swelling noted to lower right jaw.  Nothing is helping with pain. Pt is febrile

## 2013-03-29 NOTE — ED Provider Notes (Signed)
Medical screening examination/treatment/procedure(s) were performed by non-physician practitioner and as supervising physician I was immediately available for consultation/collaboration.   Carleene Cooper III, MD 03/29/13 1257

## 2013-07-05 ENCOUNTER — Encounter (HOSPITAL_COMMUNITY): Payer: Self-pay | Admitting: Emergency Medicine

## 2013-07-05 ENCOUNTER — Emergency Department (HOSPITAL_COMMUNITY)
Admission: EM | Admit: 2013-07-05 | Discharge: 2013-07-05 | Disposition: A | Discharge status: Home / Self Care | Payer: Self-pay | Attending: Emergency Medicine | Admitting: Emergency Medicine

## 2013-07-05 DIAGNOSIS — D649 Anemia, unspecified: Secondary | ICD-10-CM | POA: Insufficient documentation

## 2013-07-05 DIAGNOSIS — R21 Rash and other nonspecific skin eruption: Secondary | ICD-10-CM | POA: Insufficient documentation

## 2013-07-05 MED ORDER — PERMETHRIN 5 % EX CREA: TOPICAL_CREAM | CUTANEOUS | Status: DC

## 2013-07-05 NOTE — ED Provider Notes (Signed)
  CSN: [id number]     Arrival date & time 07/05/13  1191 History     First MD Initiated Contact with Patient 07/05/13 [phone]     Chief Complaint  Patient presents with  . Rash   (Consider location/radiation/quality/duration/timing/severity/associated sxs/prior Treatment) HPI Comments: Patient presents with a chief complaint of rash.  Rash has been present for the past 2 weeks.  Rash located on the wrists and lower back.  She reports that the rash is very pruritic.  She has attempted putting bleach on the rash with no improvement.  She states that she has never had a rash like this before.  No known contacts with similar rash.  She denies fever or chills.  Denies any drainage from the area.  Denies any new medications, soaps, detergents, or lotions.  Denies any known contact to [employer].  The history is provided by the patient.    Past Medical History  Diagnosis Date  . Anemia    Past Surgical History  Procedure Laterality Date  . Cesarean section      x3  . Hernia repair     History reviewed. No pertinent family history. History  Substance Use Topics  . Smoking status: Never Smoker   . Smokeless tobacco: Not on file  . Alcohol Use: Yes     Comment: occ   OB History   Grav Para Term Preterm Abortions TAB SAB Ect Mult Living                 Review of Systems  Skin: Positive for rash.  All other systems reviewed and are negative.    Allergies  Review of patient's allergies indicates no known allergies.  Home Medications  No current outpatient prescriptions on file. BP 107/57  Pulse 67  Temp(Src) 98.4 F (36.9 C) (Oral)  Resp 18  SpO2 100% Physical Exam  Nursing note and vitals reviewed. Constitutional: She appears well-developed and well-nourished.  HENT:  Head: Normocephalic and atraumatic.  Mouth/Throat: Oropharynx is clear and moist.  Neck: Normal range of motion. Neck supple.  Cardiovascular: Normal rate, regular rhythm and normal heart sounds.    Pulmonary/Chest: Effort normal and breath sounds normal.  Neurological: She is alert.  Skin: Skin is warm and dry. Rash noted.  Erythematous papular rash located on the flexor aspect of the wrist and the lateral lower back bilaterally  Psychiatric: She has a normal mood and affect.    ED Course   Procedures (including critical care time)  Labs Reviewed - No data to display No results found. No diagnosis found.  MDM  Patient presents with a rash of the wrists and the lower back.  Appearance of rash similar to Scabies.  Will treat patient for scabies with Permethrin.   Patient instructed to follow up with PCP if rash does not improve.  Pascal Lux [place], PA-C 07/05/13 1528

## 2013-07-05 NOTE — ED Notes (Signed)
Pt c/o itchy rash to wrists and lower back x 2 weeks; pt sts started on lower back and now on wrists

## 2013-07-07 NOTE — ED Provider Notes (Signed)
Medical screening examination/treatment/procedure(s) were performed by non-physician practitioner and as supervising physician I was immediately available for consultation/collaboration.   Laray Anger, DO 07/07/13 2048

## 2014-03-08 ENCOUNTER — Ambulatory Visit: Payer: Self-pay | Admitting: Advanced Practice Midwife

## 2014-04-12 ENCOUNTER — Ambulatory Visit: Payer: Self-pay | Admitting: Advanced Practice Midwife

## 2014-04-17 ENCOUNTER — Emergency Department (HOSPITAL_COMMUNITY)
Admission: EM | Admit: 2014-04-17 | Discharge: 2014-04-17 | Disposition: A | Discharge status: Home / Self Care | Payer: Medicaid Other | Attending: Emergency Medicine | Admitting: Emergency Medicine

## 2014-04-17 ENCOUNTER — Encounter (HOSPITAL_COMMUNITY): Payer: Self-pay | Admitting: Emergency Medicine

## 2014-04-17 DIAGNOSIS — K0889 Other specified disorders of teeth and supporting structures: Secondary | ICD-10-CM

## 2014-04-17 DIAGNOSIS — K089 Disorder of teeth and supporting structures, unspecified: Secondary | ICD-10-CM | POA: Insufficient documentation

## 2014-04-17 DIAGNOSIS — Z862 Personal history of diseases of the blood and blood-forming organs and certain disorders involving the immune mechanism: Secondary | ICD-10-CM | POA: Insufficient documentation

## 2014-04-17 DIAGNOSIS — B9689 Other specified bacterial agents as the cause of diseases classified elsewhere: Secondary | ICD-10-CM

## 2014-04-17 DIAGNOSIS — F17200 Nicotine dependence, unspecified, uncomplicated: Secondary | ICD-10-CM | POA: Insufficient documentation

## 2014-04-17 DIAGNOSIS — N949 Unspecified condition associated with female genital organs and menstrual cycle: Secondary | ICD-10-CM | POA: Insufficient documentation

## 2014-04-17 DIAGNOSIS — Z3202 Encounter for pregnancy test, result negative: Secondary | ICD-10-CM | POA: Insufficient documentation

## 2014-04-17 DIAGNOSIS — N760 Acute vaginitis: Secondary | ICD-10-CM | POA: Insufficient documentation

## 2014-04-17 DIAGNOSIS — N938 Other specified abnormal uterine and vaginal bleeding: Secondary | ICD-10-CM | POA: Insufficient documentation

## 2014-04-17 DIAGNOSIS — A499 Bacterial infection, unspecified: Secondary | ICD-10-CM | POA: Insufficient documentation

## 2014-04-17 LAB — POC URINE PREG, ED: Preg Test, Ur: NEGATIVE

## 2014-04-17 LAB — URINALYSIS, ROUTINE W REFLEX MICROSCOPIC
Bilirubin Urine: NEGATIVE
GLUCOSE, UA: NEGATIVE mg/dL
HGB URINE DIPSTICK: NEGATIVE
KETONES UR: NEGATIVE mg/dL
Leukocytes, UA: NEGATIVE
Nitrite: NEGATIVE
PROTEIN: NEGATIVE mg/dL
Specific Gravity, Urine: 1.019 (ref 1.005–1.030)
UROBILINOGEN UA: 1 mg/dL (ref 0.0–1.0)
pH: 7.5 (ref 5.0–8.0)

## 2014-04-17 LAB — WET PREP, GENITAL
TRICH WET PREP: NONE SEEN
Yeast Wet Prep HPF POC: NONE SEEN

## 2014-04-17 MED ORDER — PENICILLIN V POTASSIUM 500 MG PO TABS: 500.0000 mg | ORAL_TABLET | Freq: Four times a day (QID) | ORAL | Status: DC

## 2014-04-17 MED ORDER — HYDROCODONE-ACETAMINOPHEN 5-325 MG PO TABS: 1.0000 | ORAL_TABLET | Freq: Four times a day (QID) | ORAL | Status: DC | PRN

## 2014-04-17 MED ORDER — AZITHROMYCIN 250 MG PO TABS
1000.0000 mg | ORAL_TABLET | Freq: Once | ORAL | Status: AC
  Administered 2014-04-17: 1000 mg via ORAL
  Filled 2014-04-17: qty 4

## 2014-04-17 MED ORDER — LIDOCAINE HCL (PF) 1 % IJ SOLN
INTRAMUSCULAR | Status: AC
  Administered 2014-04-17: 5 mL
  Filled 2014-04-17: qty 5

## 2014-04-17 MED ORDER — METRONIDAZOLE 500 MG PO TABS: 500.0000 mg | ORAL_TABLET | Freq: Two times a day (BID) | ORAL | Status: DC

## 2014-04-17 MED ORDER — CEFTRIAXONE SODIUM 250 MG IJ SOLR
250.0000 mg | Freq: Once | INTRAMUSCULAR | Status: AC
  Administered 2014-04-17: 250 mg via INTRAMUSCULAR
  Filled 2014-04-17: qty 250

## 2014-04-17 NOTE — ED Notes (Signed)
She states "my period smells like feces for the past 2 months and i have a sore in my mouth that is squishy and painful."

## 2014-04-17 NOTE — ED Provider Notes (Signed)
CSN: [id number]     Arrival date & time 04/17/14  1046 History   First MD Initiated Contact with Patient 04/17/14 1110     Chief Complaint  Patient presents with  . Dental Pain  . Menstrual Problem     (Consider location/radiation/quality/duration/timing/severity/associated sxs/prior Treatment) HPI Comments: Patient is a 28 year old female with history of anemia who presents today with complaint of a malodorous menstrual cycle and dental pain. She states that over the past 2 months her periods have smelled "like feces". They have been lasting one week. Her last menstrual cycle was on 5/21. She denies any abnormal vaginal discharge while she is not having her period. She states that she has had a sore on the right of her mouth for the past month. It was initially very hard, but has become soft. It bothers her only when she is trying to eat something. She denies any fever, chills, nausea, vomiting, abdominal pain, trouble swallowing, shortness of breath.  The history is provided by the patient. No language interpreter was used.    Past Medical History  Diagnosis Date  . Anemia    Past Surgical History  Procedure Laterality Date  . Cesarean section      x3  . Hernia repair     History reviewed. No pertinent family history. History  Substance Use Topics  . Smoking status: Current Every Day Smoker    Types: Cigarettes  . Smokeless tobacco: Not on file  . Alcohol Use: Yes     Comment: occ   OB History   Grav Para Term Preterm Abortions TAB SAB Ect Mult Living                 Review of Systems  Constitutional: Negative for fever and chills.  HENT: Positive for dental problem.   Respiratory: Negative for shortness of breath.   Cardiovascular: Negative for chest pain.  Gastrointestinal: Negative for nausea, vomiting and abdominal pain.  Genitourinary: Positive for menstrual problem. Negative for vaginal discharge and pelvic pain.  All other systems reviewed and are  negative.     Allergies  Review of patient's allergies indicates no known allergies.  Home Medications   Prior to Admission medications   Not on File   BP 107/50  Pulse 71  Temp(Src) 99.1 F (37.3 C)  Resp 16  SpO2 100%  LMP 03/31/2014 Physical Exam  Nursing note and vitals reviewed. Constitutional: She is oriented to person, place, and time. She appears well-developed and well-nourished. No distress.  HENT:  Head: Normocephalic and atraumatic.  Right Ear: External ear normal.  Left Ear: External ear normal.  Nose: Nose normal.  Mouth/Throat: Uvula is midline and oropharynx is clear and moist. No trismus in the jaw.  No trismus, submental edema, tongue elevation. There is an area of swelling medial to right bicuspid. Patient is maintaining secretions easily.   Eyes: Conjunctivae are normal.  Neck: Normal range of motion.  Cardiovascular: Normal rate, regular rhythm and normal heart sounds.   Pulmonary/Chest: Effort normal and breath sounds normal. No stridor. No respiratory distress. She has no wheezes. She has no rales.  Abdominal: Soft. She exhibits no distension. There is no tenderness.  Genitourinary: There is no rash, tenderness, lesion or injury on the right labia. There is no rash, tenderness, lesion or injury on the left labia. Uterus is tender. Cervix exhibits discharge. Cervix exhibits no motion tenderness and no friability. Right adnexum displays tenderness. Right adnexum displays no mass and no fullness. Left adnexum displays  tenderness. Left adnexum displays no mass and no fullness. No erythema, tenderness or bleeding around the vagina. No foreign body around the vagina. No signs of injury around the vagina. Vaginal discharge found.  Mild, diffuse tenderness throughout pelvic exam. Copious amount of white discharge throughout vaginal vault.   Musculoskeletal: Normal range of motion.  Neurological: She is alert and oriented to person, place, and time. She has normal  strength.  Skin: Skin is warm and dry. She is not diaphoretic. No erythema.  Psychiatric: She has a normal mood and affect. Her behavior is normal.    ED Course  Procedures (including critical care time) Labs Review Labs Reviewed  WET PREP, GENITAL - Abnormal; Notable for the following:    Clue Cells Wet Prep HPF POC MANY (*)    WBC, Wet Prep HPF POC FEW (*)    All other components within normal limits  GC/CHLAMYDIA PROBE AMP  URINE CULTURE  URINALYSIS, ROUTINE W REFLEX MICROSCOPIC  POC URINE PREG, ED    Imaging Review No results found.   EKG Interpretation None      MDM   Final diagnoses:  Bacterial vaginosis  Pain, dental   Patient presents to ED with malodorous menstrual cycle. Patient found to have many clue cells on wet prep. Will treat for BV. This is likely the cause of her malodorous discharge. No CMT or concern for PID. No concern for TOA, torsion, ectopic pregnancy. Patient encouraged to follow up at GYN office. Treated for GC/chlaymdia in ED and discussed with patient that she will receive a phone call if this is positive. Patient also with toothache.  No gross abscess.  Exam unconcerning for Ludwig's angina or spread of infection.  Will treat with penicillin and pain medicine.  Urged patient to follow-up with dentist.      Mora Bellman, PA-C 04/17/14 1307

## 2014-04-17 NOTE — Discharge Instructions (Signed)
Bacterial Vaginosis Bacterial vaginosis is a vaginal infection that occurs when the normal balance of bacteria in the vagina is disrupted. It results from an overgrowth of certain bacteria. This is the most common vaginal infection in women of childbearing age. Treatment is important to prevent complications, especially in pregnant women, as it can cause a premature delivery. CAUSES  Bacterial vaginosis is caused by an increase in harmful bacteria that are normally present in smaller amounts in the vagina. Several different kinds of bacteria can cause bacterial vaginosis. However, the reason that the condition develops is not fully understood. RISK FACTORS Certain activities or behaviors can put you at an increased risk of developing bacterial vaginosis, including:  Having a new sex partner or multiple sex partners.  Douching.  Using an intrauterine device (IUD) for contraception. Women do not get bacterial vaginosis from toilet seats, bedding, swimming pools, or contact with objects around them. SIGNS AND SYMPTOMS  Some women with bacterial vaginosis have no signs or symptoms. Common symptoms include:  Grey vaginal discharge.  A fishlike odor with discharge, especially after sexual intercourse.  Itching or burning of the vagina and vulva.  Burning or pain with urination. DIAGNOSIS  Your health care provider will take a medical history and examine the vagina for signs of bacterial vaginosis. A sample of vaginal fluid may be taken. Your health care provider will look at this sample under a microscope to check for bacteria and abnormal cells. A vaginal pH test may also be done.  TREATMENT  Bacterial vaginosis may be treated with antibiotic medicines. These may be given in the form of a pill or a vaginal cream. A second round of antibiotics may be prescribed if the condition comes back after treatment.  HOME CARE INSTRUCTIONS   Only take over-the-counter or prescription medicines as  directed by your health care provider.  If antibiotic medicine was prescribed, take it as directed. Make sure you finish it even if you start to feel better.  Do not have sex until treatment is completed.  Tell all sexual partners that you have a vaginal infection. They should see their health care provider and be treated if they have problems, such as a mild rash or itching.  Practice safe sex by using condoms and only having one sex partner. SEEK MEDICAL CARE IF:   Your symptoms are not improving after 3 days of treatment.  You have increased discharge or pain.  You have a fever. MAKE SURE YOU:   Understand these instructions.  Will watch your condition.  Will get help right away if you are not doing well or get worse. FOR MORE INFORMATION  Centers for Disease Control and Prevention, Division of STD Prevention: SolutionApps.co.za American Sexual Health Association (ASHA): www.ashastd.org  Document Released: 10/28/2005 Document Revised: 08/18/2013 Document Reviewed: 06/09/2013 [facility] Patient Information 2014 [place], [place].  Dental Pain Toothache is pain in or around a tooth. It may get worse with chewing or with cold or heat.  HOME CARE  Your dentist may use a numbing medicine during treatment. If so, you may need to avoid eating until the medicine wears off. Ask your dentist about this.  Only take medicine as told by your dentist or doctor.  Avoid chewing food near the painful tooth until after all treatment is done. Ask your dentist about this. GET HELP RIGHT AWAY IF:   The problem gets worse or new problems appear.  You have a fever.  There is redness and puffiness (swelling) of the face, jaw,  or neck.  You cannot open your mouth.  There is pain in the jaw.  There is very bad pain that is not helped by medicine. MAKE SURE YOU:   Understand these instructions.  Will watch your condition.  Will get help right away if you are not doing well or get  worse. Document Released: 04/15/2008 Document Revised: 01/20/2012 Document Reviewed: 04/15/2008 [facility] Patient Information 2014 [place], [place].  Return to the emergency room for fever, change in vision, redness to the face that rapidly spreads towards the eye, nausea or vomiting, difficulty swallowing or shortness of breath.  You may follow with the dentist listed above. Alternatively,  you may also contact Onalee Hua and Nuala Alpha who run a low-cost dental clinic at their phone number is [phone]. You may also call [phone]  Dental Assistance If the dentist on-call cannot see you, please use the resources below:   Patients with Medicaid: [facility] [phone] W. [address], [phone] [address], [phone]  If unable to pay, or uninsured, contact [facility] [phone]) or [facility] Department [phone] in [place], [phone] in [facility]) to become qualified for the adult dental clinic  Other Low-Cost Community Dental Services: [facility] [address] Natasha Bence [place], [place], [address]    [phone], Ext. [phone]    2nd and 4th Thursday of the month at 6:30am    10 clients each day by appointment, can sometimes see walk-in     patients if someone does not show for an appointment [facility]- [address] Ether Griffins [place], [place], [address]    [phone] [facility] [address], [place], [place], [address]    [phone]  [facility] Health Department- [phone] [facility] Health Department- [phone] [facility] [phone]   Emergency Department Resource Guide 1) Find a Doctor and Pay Out of Pocket Although you won't have to find out who is covered by your insurance plan, it is a good idea to ask around and get recommendations. You will then need to call the office and see if the doctor you have chosen will accept you as a new patient and what types of options they offer for  patients who are self-pay. Some doctors offer discounts or will set up payment plans for their patients who do not have insurance, but you will need to ask so you aren't surprised when you get to your appointment.  2) Contact Your Local Health Department Not all health departments have doctors that can see patients for sick visits, but many do, so it is worth a call to see if yours does. If you don't know where your local health department is, you can check in your phone book. The CDC also has a tool to help you locate your state's health department, and many state websites also have listings of all of their local health departments.  3) Find a Walk-in Clinic If your illness is not likely to be very severe or complicated, you may want to try a walk in clinic. These are popping up all over the country in pharmacies, drugstores, and shopping centers. They're usually staffed by nurse practitioners or physician assistants that have been trained to treat common illnesses and complaints. They're usually fairly quick and inexpensive. However, if you have serious medical issues or chronic medical problems, these are probably not your best option.  No Primary Care Doctor: - Call Health Connect at  [phone] - they can help you locate a primary  care doctor that  accepts your insurance, provides certain services, etc. - Physician Referral Service- [phone]  Chronic Pain Problems: Organization         Address  Phone   Notes  [facility]  [phone] Patients need to be referred by their primary care doctor.   Medication Assistance: Organization         Address  Phone   Notes  [facility] Medication [facility] [address] [place] [place], [place] [address] [phone] --Must be a resident of [facility] -- Must have NO insurance coverage whatsoever (no Medicaid/ Medicare, etc.) -- The pt. MUST have a primary care doctor that directs their care regularly  and follows them in the community   [facility]ist  [phone]   [employer]  [phone]    Agencies that provide inexpensive medical care: Organization         Address  Phone   Notes  [facility]  [phone]   [facility]    [phone]   [facility] [address] [place], [place] [address] [phone]   [facility] [address] [place] [place] [phone]   Planned Parenthood    [phone]   [facility]    [phone]   Community [facility] and [facility]  [address], [place] Phone:  [phone], Fax:  [phone] Hours of Operation:  9 am - 6 pm, M-F.  Also accepts Medicaid/Medicare and self-pay.  [facility]  [address], [place] Phone: [phone], Fax: [phone]. Hours of Operation:  8:30 am - 5:30 pm, M-F.  Also accepts Medicaid and self-pay.  [facility] [place] [address], [place] Phone: [phone]   [facility] [address] Natasha Bence [place], [place] [phone], Ext. [phone] Mondays & Thursdays: 7-9 AM.  First 15 patients are seen on a first come, first serve basis.    Medicaid-accepting [facility] Providers:  Organization         Address  Phone   Notes  [facility] [address],  [phone] Also accepts self-pay patients.  [facility] [address] Laurell Josephs [place], [place]  [phone]   [facility] [address], [place] [phone]   [facility] [address] [place] [phone]   Renaye Rakers [address], [place]   [phone] Only accepts [place] patients after they have their name applied to their card.   Self-Pay (no insurance) in [facility]:  Organization         Address  Phone   Notes  Sickle  Cell Patients, [facility] Medicine [address] [place], [place] [phone]   [facility] [address] [place], [place] [phone]   [facility] [place]  [address], [place] [phone]   [facility]/Dr. Osei-Bonsu  [address], [place] or [address], [place] [phone] Phone number for both [place] and [place] locations is the same.  [facility] [place] [address], [place] [phone]   [facility] [address], [place] or [address] [phone]  [facility] [address] [place] [phone], phone; [phone], fax Sees patients 1st and 3rd Saturday of every month.  Must not qualify for public or private insurance (i.e. Medicaid, Medicare, [place] Health Choice, Veterans' Benefits)  Household income should be no more than 200% of the poverty level The clinic cannot treat you if you are pregnant or think you are pregnant  Sexually transmitted diseases are not treated at the clinic.    Dental Care: Organization         Address  Phone  Notes  [facility] Department of [facility] [address] [place], [place] [phone] Accepts children up to age 21 who are enrolled in [place] or [place] Health Choice; pregnant women with a Medicaid card; and children who have applied for Medicaid or [place] Health Choice, but were declined, whose parents can pay a reduced fee at time of service.  [facility] Department of [facility]  [address], [place] [phone] Accepts children up to age 16 who are enrolled in [place] or [place] Health Choice; pregnant women with a Medicaid card; and children who have applied for Medicaid or [place] Health Choice, but were declined, whose parents can pay a reduced fee at time of service.  [facility]  Access PROGRAM  [address] [place], [place] [phone] Patients are seen by appointment only. Walk-ins are not accepted. [facility] will see patients 24 years of age and older. Monday - Tuesday (8am-5pm) Most Wednesdays (8:30-5pm) $30 per visit, cash only  [facility] Adult Dental Access PROGRAM  [address], [facility] [phone] Patients are seen by appointment only. Walk-ins are not accepted. [facility] will see patients 75 years of age and older. One Wednesday Evening (Monthly: Volunteer Based).  $30 per visit, cash only  [employer]  [phone] for adults; Children under age 6, call Graduate Pediatric Dentistry at [phone]. Children aged 3-14, please call [phone] to request a pediatric application.  Dental services are provided in all areas of dental care including fillings, crowns and bridges, complete and partial dentures, implants, gum treatment, root canals, and extractions. Preventive care is also provided. Treatment is provided to both adults and children. Patients are selected via a lottery and there is often a waiting list.   [facility] [address], [place]  [phone] [email or web address]   [facility] [address] [place], [place] [phone], Ext. [phone] Second and Fourth Thursday of each month, opens at 6:30 AM; Clinic ends at 9 AM.  Patients are seen on a first-come first-served basis, and a limited number are seen during each clinic.   [facility]  [address] Ether Griffins [place], [place] [phone]   Eligibility Requirements You must have lived in [place], [place], or [place] counties for at least the last three months.   You cannot be eligible for state or federal sponsored [place], including [employer], [place], or [employer].   You generally cannot be eligible for healthcare insurance through your employer.    How to apply: Eligibility  screenings are held every Tuesday and Wednesday afternoon from 1:00 pm until 4:00 pm. You do not need an appointment for the interview!  [facility] [address] [place], [place] [phone]   [facility] Health Department  [phone]   [facility] Health Department  [phone]   [facility]  Department  [phone]    Behavioral Health Resources in the Community: Intensive Outpatient Programs Organization         Address  Phone  Notes  [facility] [address], [place], [place] [phone]   [facility] Outpatient [address], [place], [place] [phone]   [facility] [address], [place], [place]  [phone]   [facility] [address],  [place], [place] [phone] or [phone]   Substance Abuse Resources Organization         Address  Phone  Notes  [facility] Services  [phone]   [facility]  [phone]   [facility]  [phone]   [facility]  [phone]   Residential & Outpatient Substance Abuse Program  [phone]   Psychological Services Organization         Address  Phone  Notes  [facility]  [phone]- [phone]   [facility]  [phone]- [phone]   [facility] [address], [place] [phone] or [phone]    Mobile Crisis Teams Organization         Address  Phone  Notes  [facility], Mobile Crisis Care Unit  [phone]   [facility]  [address] [place], [place] [phone]   Doristine LocksSharon DeEsch [address] [place] [phone]    Self-Help/Support Groups Organization         Address  Phone             Notes  Mental Health Assoc. of [place] - variety of support groups  336- [phone] Call for more information  Narcotics Anonymous (NA), [facility] [address], [place]  2 meetings at  this location   Statisticianesidential Treatment Programs Organization         Address  Phone  Notes  [facility] [address],    [place]  [phone]   [facility]  [address], [place] [phone], [place], [place] [phone]   [facility] [address], [place] [phone] Admissions: 8am-3pm M-F  [facility] [address]    [place], [place] [phone]   [facility] [address], [place], [place] [phone]   [facility] [address]  [place], [place] [phone]   [facility] - Intensive Outpatient [address], [place], [place] [phone]   [facility] ([facility]iction Recovery [facility] [address]  [place], [place] [phone] or [phone]   [facility] ([facility]) [address] [place], [place] [phone] Accepts Medicaid  [facility] [address]  [place] [phone] Substance Abuse/Addiction Treatment   [facility] Behavioral Health Resources Organization         Address  Phone  Notes  [facility]  [phone]) [phone]   Angie FavaJulie Brannon, PhD [address], [address] [place], [place]   [phone] or [phone]) [phone]   [facility]   [address] [place], [place] [phone]) [phone]   [facility] [address], [place], [place] [phone]) [phone] Insurance/Medicaid/sponsorship through [employer] [facility]  [facility] [address]                                    [place], [place] [phone]) [phone] Therapy/tele-psych/case  [facility]  [address]   [place], [place] [phone]    Dr. Lolly Mustache  [phone]   [facility] [place]  [employer] [facility] Dept. 1) [address], [place] 2) [address], [place] 3)  [address] [place], [place] [phone]  [phone]   [facility] Child Abuse Hotline [phone] or [phone] (After Hours)

## 2014-04-17 NOTE — ED Provider Notes (Signed)
Medical screening examination/treatment/procedure(s) were performed by non-physician practitioner and as supervising physician I was immediately available for consultation/collaboration.   EKG Interpretation None       Ethelda Chick, MD 04/17/14 [phone]

## 2014-04-18 LAB — URINE CULTURE: Colony Count: 2000

## 2014-04-18 LAB — GC/CHLAMYDIA PROBE AMP
CT Probe RNA: NEGATIVE
GC PROBE AMP APTIMA: NEGATIVE

## 2015-03-27 ENCOUNTER — Emergency Department (HOSPITAL_COMMUNITY)
Admission: EM | Admit: 2015-03-27 | Discharge: 2015-03-27 | Disposition: A | Discharge status: Home / Self Care | Payer: Medicaid Other | Attending: Emergency Medicine | Admitting: Emergency Medicine

## 2015-03-27 ENCOUNTER — Encounter (HOSPITAL_COMMUNITY): Payer: Self-pay | Admitting: Emergency Medicine

## 2015-03-27 DIAGNOSIS — Z862 Personal history of diseases of the blood and blood-forming organs and certain disorders involving the immune mechanism: Secondary | ICD-10-CM | POA: Diagnosis not present

## 2015-03-27 DIAGNOSIS — Z720 Tobacco use: Secondary | ICD-10-CM | POA: Diagnosis not present

## 2015-03-27 DIAGNOSIS — Z792 Long term (current) use of antibiotics: Secondary | ICD-10-CM | POA: Diagnosis not present

## 2015-03-27 DIAGNOSIS — K0889 Other specified disorders of teeth and supporting structures: Secondary | ICD-10-CM

## 2015-03-27 DIAGNOSIS — K088 Other specified disorders of teeth and supporting structures: Secondary | ICD-10-CM | POA: Insufficient documentation

## 2015-03-27 DIAGNOSIS — G479 Sleep disorder, unspecified: Secondary | ICD-10-CM | POA: Diagnosis not present

## 2015-03-27 MED ORDER — IBUPROFEN 800 MG PO TABS
800.0000 mg | ORAL_TABLET | Freq: Once | ORAL | Status: AC
  Administered 2015-03-27: 800 mg via ORAL
  Filled 2015-03-27: qty 1

## 2015-03-27 MED ORDER — PENICILLIN V POTASSIUM 250 MG PO TABS: 250.0000 mg | ORAL_TABLET | Freq: Four times a day (QID) | ORAL | Status: AC

## 2015-03-27 NOTE — ED Provider Notes (Signed)
CSN: [id number]     Arrival date & time 03/27/15  0621 History   First MD Initiated Contact with Patient 03/27/15 [phone]     Chief Complaint  Patient presents with  . Dental Pain     (Consider location/radiation/quality/duration/timing/severity/associated sxs/prior Treatment) HPI Comments: Patient presents to the emergency department with chief complaint of dental pain. She states that her upper left rear molar has been causing pain for the past week. She reports mild swelling to the left cheek. Denies difficulty swallowing. Denies any fevers or chills. She does not have a dentist. She states pain is moderate to severe. It is aggravated with chewing.  The history is provided by the patient. No language interpreter was used.    Past Medical History  Diagnosis Date  . Anemia    Past Surgical History  Procedure Laterality Date  . Cesarean section      x3  . Hernia repair     History reviewed. No pertinent family history. History  Substance Use Topics  . Smoking status: Current Every Day Smoker    Types: Cigarettes  . Smokeless tobacco: Not on file  . Alcohol Use: Yes     Comment: occ   OB History    No data available     Review of Systems  Constitutional: Negative for fever and chills.  HENT: Positive for dental problem. Negative for drooling.   Neurological: Negative for speech difficulty.  Psychiatric/Behavioral: Positive for sleep disturbance.      Allergies  Review of patient's allergies indicates no known allergies.  Home Medications   Prior to Admission medications   Medication Sig Start Date End Date Taking? Authorizing Provider  HYDROcodone-acetaminophen (NORCO/VICODIN) 5-325 MG per tablet Take 1-2 tablets by mouth every 6 (six) hours as needed for moderate pain or severe pain. 04/17/14   Junious SilkHannah Merrell, PA-C  metroNIDAZOLE (FLAGYL) 500 MG tablet Take 1 tablet (500 mg total) by mouth 2 (two) times daily. One po bid x 7 days 04/17/14   Junious SilkHannah Merrell, PA-C    penicillin v potassium (VEETID) 500 MG tablet Take 1 tablet (500 mg total) by mouth 4 (four) times daily. 04/17/14   Junious SilkHannah Merrell, PA-C   BP 109/69 mmHg  Pulse 72  Temp(Src) 97.9 F (36.6 C) (Oral)  Resp 16  Ht 5\' 4"  (1.626 m)  Wt 215 lb (97.523 kg)  BMI 36.89 kg/m2  SpO2 100%  LMP 03/08/2015 (Approximate) Physical Exam  Constitutional: She is oriented to person, place, and time. She appears well-developed and well-nourished.  HENT:  Head: Normocephalic and atraumatic.  Mouth/Throat:    Poor dentition throughout.  Affected tooth as diagrammed.  No signs of peritonsillar or tonsillar abscess.  No signs of gingival abscess. Oropharynx is clear and without exudates.  Uvula is midline.  Airway is intact. No signs of Ludwig's angina with palpation of oral and sublingual mucosa.   Eyes: Conjunctivae and EOM are normal.  Neck: Normal range of motion.  Cardiovascular: Normal rate.   Pulmonary/Chest: Effort normal.  Abdominal: She exhibits no distension.  Musculoskeletal: Normal range of motion.  Neurological: She is alert and oriented to person, place, and time.  Skin: Skin is dry.  Psychiatric: She has a normal mood and affect. Her behavior is normal. Judgment and thought content normal.  Nursing note and vitals reviewed.   ED Course  Procedures (including critical care time) Labs Review Labs Reviewed - No data to display  Imaging Review No results found.   EKG Interpretation None  MDM   Final diagnoses:  Pain, dental    Patient with toothache.  No gross abscess.  Exam unconcerning for Ludwig's angina or spread of infection.  Will treat with penicillin and OTC pain medicine.  Urged patient to follow-up with dentist.       Roxy Horsemanobert Kim Lauver, PA-C 03/27/15 [phone]  Shon Batonourtney F Horton, MD 03/27/15 [phone]

## 2015-03-27 NOTE — Discharge Instructions (Signed)

## 2015-03-27 NOTE — ED Notes (Signed)
Patient here with upper left dental pain. States "I have an abscess". Minor swelling noted in left cheek.

## 2015-07-12 ENCOUNTER — Ambulatory Visit
Admission: RE | Admit: 2015-07-12 | Discharge: 2015-07-12 | Disposition: A | Discharge status: Home / Self Care | Payer: Medicaid Other | Source: Ambulatory Visit | Attending: Family Medicine | Admitting: Family Medicine

## 2015-07-12 ENCOUNTER — Other Ambulatory Visit: Payer: Self-pay | Admitting: Family Medicine

## 2015-07-12 DIAGNOSIS — Z111 Encounter for screening for respiratory tuberculosis: Secondary | ICD-10-CM

## 2016-01-19 ENCOUNTER — Ambulatory Visit (HOSPITAL_COMMUNITY)
Admission: RE | Admit: 2016-01-19 | Discharge: 2016-01-19 | Disposition: A | Discharge status: Home / Self Care | Payer: Medicaid Other | Source: Ambulatory Visit | Attending: Family Medicine | Admitting: Family Medicine

## 2016-01-19 DIAGNOSIS — N938 Other specified abnormal uterine and vaginal bleeding: Secondary | ICD-10-CM | POA: Diagnosis present

## 2016-01-19 LAB — ABO/RH: ABO/RH(D): A POS

## 2016-01-19 LAB — PREPARE RBC (CROSSMATCH)

## 2016-01-19 MED ORDER — SODIUM CHLORIDE 0.9 % IV SOLN
Freq: Once | INTRAVENOUS | Status: AC
  Administered 2016-01-19: 11:00:00 via INTRAVENOUS

## 2016-01-19 NOTE — Progress Notes (Signed)
PCP: Renaye RakersVeita Bland MD  Associated diagnosis: Excessive Vaginal Bleeding  Procedure: 2 units PRBC's ordered and transfused. Transfusion completed. No reaction.   Tolerated transfusion well.  Patient alert, oriented and ambulatory at discharge. Verbalized appointment time and follow up with PCP. Discharged to home.

## 2016-01-22 LAB — TYPE AND SCREEN
ABO/RH(D): A POS
ANTIBODY SCREEN: NEGATIVE
UNIT DIVISION: 0
Unit division: 0

## 2016-02-17 ENCOUNTER — Encounter: Payer: Self-pay | Admitting: *Deleted

## 2016-02-21 ENCOUNTER — Ambulatory Visit (INDEPENDENT_AMBULATORY_CARE_PROVIDER_SITE_OTHER): Payer: Medicaid Other | Admitting: Obstetrics & Gynecology

## 2016-02-21 ENCOUNTER — Encounter: Payer: Self-pay | Admitting: Obstetrics & Gynecology

## 2016-02-21 VITALS — BP 109/60 | HR 59 | Wt 224.7 lb

## 2016-02-21 DIAGNOSIS — N920 Excessive and frequent menstruation with regular cycle: Secondary | ICD-10-CM | POA: Insufficient documentation

## 2016-02-21 LAB — CBC
HCT: 35.7 % (ref 35.0–45.0)
HEMOGLOBIN: 10.5 g/dL — AB (ref 11.7–15.5)
MCH: 21.3 pg — ABNORMAL LOW (ref 27.0–33.0)
MCHC: 29.4 g/dL — AB (ref 32.0–36.0)
MCV: 72.4 fL — ABNORMAL LOW (ref 80.0–100.0)
Platelets: 373 10*3/uL (ref 140–400)
RBC: 4.93 MIL/uL (ref 3.80–5.10)
RDW: 24 % — AB (ref 11.0–15.0)
WBC: 7.6 10*3/uL (ref 3.8–10.8)

## 2016-02-21 MED ORDER — TRANEXAMIC ACID 650 MG PO TABS: 1300.0000 mg | ORAL_TABLET | Freq: Three times a day (TID) | ORAL | Status: DC

## 2016-02-21 NOTE — Progress Notes (Signed)
US scheduled for April 20th @ 1300.  Pt notified.

## 2016-02-21 NOTE — Patient Instructions (Signed)

## 2016-02-21 NOTE — Progress Notes (Signed)
   CLINIC ENCOUNTER NOTE  History:  30 y.o. [id number] here today for evaluation of menorrhagia for several years.  Comes monthly, lasts 7 days, very heavy flow.  Was symptomatic last month and received transfusion of 2 units of pRBCs.  Has been on OCPs and Depo Provera, patch in the past, had unpleasant side effects and does not want to use these again.  She denies any current abnormal vaginal discharge, bleeding, pelvic pain or other concerns.   Past Medical History  Diagnosis Date  . Anemia     Past Surgical History  Procedure Laterality Date  . Cesarean section      x3  . Hernia repair      The following portions of the patient's history were reviewed and updated as appropriate: allergies, current medications, past family history, past medical history, past social history, past surgical history and problem list.   Health Maintenance:  Pap several years ago.  Review of Systems:  Pertinent items noted in HPI and remainder of comprehensive ROS otherwise negative.  Objective:  Physical Exam BP 109/60 mmHg  Pulse 59  Wt 224 lb 11.2 oz (101.923 kg)  LMP 01/02/2016 (Exact Date) CONSTITUTIONAL: Well-developed, well-nourished female in no acute distress.  HENT:  Normocephalic, atraumatic. External right and left ear normal. Oropharynx is clear and moist EYES: Conjunctivae and EOM are normal. Pupils are equal, round, and reactive to light. No scleral icterus.  NECK: Normal range of motion, supple, no masses SKIN: Skin is warm and dry. No rash noted. Not diaphoretic. No erythema. No pallor. NEUROLOGIC: Alert and oriented to person, place, and time. Normal reflexes, muscle tone coordination. No cranial nerve deficit noted. PSYCHIATRIC: Normal mood and affect. Normal behavior. Normal judgment and thought content. CARDIOVASCULAR: Normal heart rate noted RESPIRATORY: Effort and breath sounds normal, no problems with respiration noted ABDOMEN: Soft, no distention noted.   PELVIC:  Deferred MUSCULOSKELETAL: Normal range of motion. No edema noted.  Labs and Imaging No results found.  Assessment & Plan:  1. Menorrhagia with regular cycle. Desires future fertility. - CBC - TSH - US Pelvis Complete; Future - US Transvaginal Non-OB; Future - tranexamic acid (LYSTEDA) 650 MG TABS tablet; Take 2 tablets (1,300 mg total) by mouth 3 (three) times daily. Take during menses for a maximum of five days  Dispense: 30 tablet; Refill: 2 Discussed other management options for abnormal uterine bleeding including tranexamic acid (Lysteda), oral progesterone (Megace), Depo Provera, Progestin IUD, endometrial ablation (Novasure/Hydrothermal Ablation) or hysterectomy as definitive surgical management.  Discussed risks and benefits of each method.   Patient desires Delanna NoticeLysteda for now, thinking about Progestin IUD.  Printed patient education handouts were given to the patient to review at home.  Lysteda as needed for now,  bleeding precautions reviewed.  Routine preventative health maintenance measures emphasized. Please refer to After Visit Summary for other counseling recommendations.   Return in about 2 weeks (around 03/06/2016) for Follow up menorrhagia and pap smear.   Total face-to-face time with patient: 15 minutes. Over 50% of encounter was spent on counseling and coordination of care.   Jaynie CollinsUGONNA  Dillon Mcreynolds, MD, FACOG Attending Obstetrician & Gynecologist, [facility]

## 2016-02-22 LAB — TSH: TSH: 0.74 m[IU]/L

## 2016-02-29 ENCOUNTER — Ambulatory Visit (HOSPITAL_COMMUNITY)
Admission: RE | Admit: 2016-02-29 | Discharge: 2016-02-29 | Disposition: A | Discharge status: Home / Self Care | Payer: Medicaid Other | Source: Ambulatory Visit | Attending: Obstetrics & Gynecology | Admitting: Obstetrics & Gynecology

## 2016-02-29 ENCOUNTER — Telehealth: Payer: Self-pay | Admitting: *Deleted

## 2016-02-29 DIAGNOSIS — N920 Excessive and frequent menstruation with regular cycle: Secondary | ICD-10-CM | POA: Insufficient documentation

## 2016-02-29 NOTE — Telephone Encounter (Signed)
Received a call stating need preauth for pelvic us to be done today at 1pm. Preauth obtained.

## 2016-03-12 DIAGNOSIS — H18609 Keratoconus, unspecified, unspecified eye: Secondary | ICD-10-CM | POA: Insufficient documentation

## 2016-03-15 ENCOUNTER — Encounter: Payer: Self-pay | Admitting: *Deleted

## 2016-03-18 ENCOUNTER — Ambulatory Visit (INDEPENDENT_AMBULATORY_CARE_PROVIDER_SITE_OTHER): Payer: Medicaid Other | Admitting: Obstetrics & Gynecology

## 2016-03-18 ENCOUNTER — Encounter: Payer: Self-pay | Admitting: Obstetrics & Gynecology

## 2016-03-18 VITALS — BP 127/74 | HR 66 | Ht 64.0 in | Wt 229.0 lb

## 2016-03-18 DIAGNOSIS — N920 Excessive and frequent menstruation with regular cycle: Secondary | ICD-10-CM

## 2016-03-18 MED ORDER — NORETHINDRONE ACETATE 5 MG PO TABS: 10.0000 mg | ORAL_TABLET | Freq: Every day | ORAL | Status: DC

## 2016-03-18 NOTE — Patient Instructions (Signed)
Return to clinic for any scheduled appointments or for any gynecologic concerns as needed.   

## 2016-03-18 NOTE — Progress Notes (Signed)
CLINIC ENCOUNTER NOTE  History:  30 y.o. [id number] here today for follow up after being seen on 02/21/16 for menorrhagia.  At that visit, we discussed fertility-sparing management options for AUB in detail, she opted to try Lysteda as she was trying to avoid hormones.  She reports that Lysteda did not work during her last period, she noticed heavier bleeding but shorter duration (3 instead of 5 days).  Took Lysteda for 3 days.  She was also scheduled for ultrasound, she is here to discuss results. She denies any current abnormal vaginal discharge, bleeding, pelvic pain or other concerns.   Past Medical History  Diagnosis Date  . Anemia     Past Surgical History  Procedure Laterality Date  . Cesarean section      x3  . Hernia repair      The following portions of the patient's history were reviewed and updated as appropriate: allergies, current medications, past family history, past medical history, past social history, past surgical history and problem list.   Health Maintenance:  Normal pap several years ago.  Review of Systems:  Pertinent items noted in HPI and remainder of comprehensive ROS otherwise negative.  Objective:  Physical Exam BP 127/74 mmHg  Pulse 66  Ht 5\' 4"  (1.626 m)  Wt 229 lb (103.874 kg)  BMI 39.29 kg/m2  LMP 03/13/2016 CONSTITUTIONAL: Well-developed, well-nourished female in no acute distress.  HENT:  Normocephalic, atraumatic. External right and left ear normal. Oropharynx is clear and moist EYES: Conjunctivae and EOM are normal. Pupils are equal, round, and reactive to light. No scleral icterus.  NECK: Normal range of motion, supple, no masses SKIN: Skin is warm and dry. No rash noted. Not diaphoretic. No erythema. No pallor. NEUROLOGIC: Alert and oriented to person, place, and time. Normal reflexes, muscle tone coordination. No cranial nerve deficit noted. PSYCHIATRIC: Normal mood and affect. Normal behavior. Normal judgment and thought  content. CARDIOVASCULAR: Normal heart rate noted RESPIRATORY: Effort and breath sounds normal, no problems with respiration noted ABDOMEN: Soft, no distention noted.   PELVIC: Deferred MUSCULOSKELETAL: Normal range of motion. No edema noted.  Labs and Imaging [place] Transvaginal Non-ob  02/29/2016  CLINICAL DATA:  30 year old female with chronic menorrhagia. Cesarean delivery x3, most recent 9 years prior. LMP 01/02/2016. EXAM: TRANSABDOMINAL AND TRANSVAGINAL ULTRASOUND OF PELVIS TECHNIQUE: Both transabdominal and transvaginal ultrasound examinations of the pelvis were performed. Transabdominal technique was performed for global imaging of the pelvis including uterus, ovaries, adnexal regions, and pelvic cul-de-sac. It was necessary to proceed with endovaginal exam following the transabdominal exam to visualize the endometrium and adnexa. COMPARISON:  None. FINDINGS: Uterus Measurements: 10.6 x 4.9 x 6.4 cm. Anteverted anteflexed uterus is normal in size and configuration. There is a tiny 0.7 x 0.4 cm simple fluid collection at the Cesarean scar site in the anterior lower uterine segment. No uterine fibroids or other myometrial abnormality. Endometrium Thickness: 16 mm. Small amount of simple fluid in the lower endometrial cavity, with no focal endometrial mass demonstrated. Right ovary Measurements: 3.1 x 2.2 x 1.8 cm. Normal appearance/no adnexal mass. Left ovary Measurements: 2.8 x 2.0 x 2.0 cm. Normal appearance/no adnexal mass. Other findings No abnormal free fluid. IMPRESSION: 1. No uterine fibroids. 2. Bilayer endometrial thickness 16 mm. Small amount of simple fluid in the lower endometrial cavity. No focal endometrial mass detected. If bleeding remains unresponsive to hormonal or medical therapy, focal lesion work-up with sonohysterogram should be considered. Endometrial biopsy should also be considered in pre-menopausal patients at  high risk for endometrial carcinoma. (Ref: Radiological Reasoning:  Algorithmic Workup of Abnormal Vaginal Bleeding with Endovaginal Sonography and Sonohysterography. AJR 2008; 161:W96-04) 3. Tiny simple fluid collection at the Cesarean scar site in the anterior lower uterine segment. 4. Normal ovaries.  No suspicious adnexal findings. Electronically Signed   By: Delbert Phenix M.D.   On: 02/29/2016 13:52   US Pelvis Complete  02/29/2016  CLINICAL DATA:  30 year old female with chronic menorrhagia. Cesarean delivery x3, most recent 9 years prior. LMP 01/02/2016. EXAM: TRANSABDOMINAL AND TRANSVAGINAL ULTRASOUND OF PELVIS TECHNIQUE: Both transabdominal and transvaginal ultrasound examinations of the pelvis were performed. Transabdominal technique was performed for global imaging of the pelvis including uterus, ovaries, adnexal regions, and pelvic cul-de-sac. It was necessary to proceed with endovaginal exam following the transabdominal exam to visualize the endometrium and adnexa. COMPARISON:  None. FINDINGS: Uterus Measurements: 10.6 x 4.9 x 6.4 cm. Anteverted anteflexed uterus is normal in size and configuration. There is a tiny 0.7 x 0.4 cm simple fluid collection at the Cesarean scar site in the anterior lower uterine segment. No uterine fibroids or other myometrial abnormality. Endometrium Thickness: 16 mm. Small amount of simple fluid in the lower endometrial cavity, with no focal endometrial mass demonstrated. Right ovary Measurements: 3.1 x 2.2 x 1.8 cm. Normal appearance/no adnexal mass. Left ovary Measurements: 2.8 x 2.0 x 2.0 cm. Normal appearance/no adnexal mass. Other findings No abnormal free fluid. IMPRESSION: 1. No uterine fibroids. 2. Bilayer endometrial thickness 16 mm. Small amount of simple fluid in the lower endometrial cavity. No focal endometrial mass detected. If bleeding remains unresponsive to hormonal or medical therapy, focal lesion work-up with sonohysterogram should be considered. Endometrial biopsy should also be considered in pre-menopausal patients at  high risk for endometrial carcinoma. (Ref: Radiological Reasoning: Algorithmic Workup of Abnormal Vaginal Bleeding with Endovaginal Sonography and Sonohysterography. AJR 2008; 540:J81-19) 3. Tiny simple fluid collection at the Cesarean scar site in the anterior lower uterine segment. 4. Normal ovaries.  No suspicious adnexal findings. Electronically Signed   By: Delbert Phenix M.D.   On: 02/29/2016 13:52    Assessment & Plan:  Menorrhagia with regular cycle Discussed results of ultrasound. Re-discussed management options, she will try Lysteda one more time. If it does not work, she wants to try oral progesterone.  Aygestin ordered. Will monitor response.  - norethindrone (AYGESTIN) 5 MG tablet; Take 2 tablets (10 mg total) by mouth daily.  Dispense: 60 tablet; Refill: 2 Please refer to After Visit Summary for other counseling recommendations.   Return in about 4 weeks (around 04/15/2016) for Pap smear and menorrhagia follow up .   Total face-to-face time with patient: 15 minutes. Over 50% of encounter was spent on counseling and coordination of care.   Jaynie Collins, MD, FACOG Attending Obstetrician & Gynecologist, [facility] for [facility]

## 2016-04-15 ENCOUNTER — Encounter: Payer: Self-pay | Admitting: Family Medicine

## 2016-04-15 ENCOUNTER — Ambulatory Visit (INDEPENDENT_AMBULATORY_CARE_PROVIDER_SITE_OTHER): Payer: Medicaid Other | Admitting: Family Medicine

## 2016-04-15 VITALS — BP 138/78 | HR 92 | Wt 230.6 lb

## 2016-04-15 DIAGNOSIS — N939 Abnormal uterine and vaginal bleeding, unspecified: Secondary | ICD-10-CM | POA: Diagnosis present

## 2016-04-15 DIAGNOSIS — D500 Iron deficiency anemia secondary to blood loss (chronic): Secondary | ICD-10-CM

## 2016-04-15 LAB — CBC
HEMATOCRIT: 34 % — AB (ref 35.0–45.0)
HEMOGLOBIN: 10.5 g/dL — AB (ref 11.7–15.5)
MCH: 23.5 pg — ABNORMAL LOW (ref 27.0–33.0)
MCHC: 30.9 g/dL — AB (ref 32.0–36.0)
MCV: 76.1 fL — AB (ref 80.0–100.0)
MPV: 10.4 fL (ref 7.5–12.5)
Platelets: 335 10*3/uL (ref 140–400)
RBC: 4.47 MIL/uL (ref 3.80–5.10)
RDW: 20.2 % — ABNORMAL HIGH (ref 11.0–15.0)
WBC: 5.1 10*3/uL (ref 3.8–10.8)

## 2016-04-15 MED ORDER — MEGESTROL ACETATE 40 MG PO TABS: 40.0000 mg | ORAL_TABLET | Freq: Two times a day (BID) | ORAL | Status: DC

## 2016-04-16 ENCOUNTER — Encounter: Payer: Self-pay | Admitting: Family Medicine

## 2016-04-16 NOTE — Progress Notes (Signed)
    CLINIC ENCOUNTER NOTE  History:  30 y.o. [id number] here today for follow up after being seen on 02/21/16 and 03/18/16 for menorrhagia and AUB.  She has been on Lysteda, which she didn't tolerated.  At the last appt, she was encouraged to try Lysteda again, which she did, but she continued to have continuous bleeding.  She then tried  Aygestin, which she gave her nausea and vomiting and did not stop her bleeding.  She denies any current abnormal vaginal discharge, bleeding, pelvic pain or other concerns.   I have reviewed the patients past medical, family, and social history.  I have reviewed the patient's medication list and allergies  Health Maintenance:  Normal pap several years ago.  Review of Systems:  Pertinent items noted in HPI and remainder of comprehensive ROS otherwise negative.  Objective:  Physical Exam BP 138/78 mmHg  Pulse 92  Wt 230 lb 9.6 oz (104.599 kg)  LMP 04/15/2016 CONSTITUTIONAL: Well-developed, well-nourished female in no acute distress.  HENT:  Normocephalic, atraumatic. External right and left ear normal. Oropharynx is clear and moist EYES: Conjunctivae and EOM are normal. Pupils are equal, round, and reactive to light. No scleral icterus.  SKIN: Skin is warm and dry. No rash noted. Not diaphoretic. No erythema. No pallor. NEUROLOGIC: Alert and oriented to person, place, and time. Normal reflexes, muscle tone coordination. No cranial nerve deficit noted. PSYCHIATRIC: Normal mood and affect. Normal behavior. Normal judgment and thought content. CARDIOVASCULAR: Normal heart rate noted RESPIRATORY: Effort and breath sounds normal, no problems with respiration noted ABDOMEN: Soft, no distention noted.   PELVIC: Deferred MUSCULOSKELETAL: Normal range of motion. No edema noted.  Labs and Imaging No results found.  Assessment & Plan:  1. Abnormal uterine bleeding (AUB) Discussed options with patient, including megace, provera, depo provera, IUD and surgical  options.  Patient would like to preserve fertility.  At this point, patient would like to try megace.  Will start with 40mg  BID.  Patient to call within 4-5 days if not improving or if she is having any symptoms/side effects with the megace.   - CBC  2. Iron deficiency anemia due to chronic blood loss Patient encouraged to take iron. - CBC

## 2016-04-19 ENCOUNTER — Telehealth: Payer: Self-pay | Admitting: *Deleted

## 2016-04-19 MED ORDER — MEGESTROL ACETATE 40 MG PO TABS: 80.0000 mg | ORAL_TABLET | Freq: Two times a day (BID) | ORAL | Status: DC

## 2016-04-19 NOTE — Telephone Encounter (Addendum)
Called - left message on pt's phone to increase megace to 80mg  BID.  Please call her to make sure she has increased the megace.

## 2016-04-19 NOTE — Telephone Encounter (Addendum)
Pt called to discuss the medication that Dr. Adrian BlackwaterStinson recently prescribed for her. She doesn't feel like it is working very well, she is still bleeding. Will route message to Dr. Adrian BlackwaterStinson for his input.

## 2016-04-23 NOTE — Telephone Encounter (Signed)
Pt is taken Megace 80mg  bid

## 2016-07-04 ENCOUNTER — Encounter: Payer: Self-pay | Admitting: Family Medicine

## 2016-07-04 ENCOUNTER — Ambulatory Visit (INDEPENDENT_AMBULATORY_CARE_PROVIDER_SITE_OTHER): Payer: Medicaid Other | Admitting: Family Medicine

## 2016-07-04 ENCOUNTER — Other Ambulatory Visit (HOSPITAL_COMMUNITY)
Admission: RE | Admit: 2016-07-04 | Discharge: 2016-07-04 | Disposition: A | Discharge status: Home / Self Care | Payer: Medicaid Other | Source: Ambulatory Visit | Attending: Family Medicine | Admitting: Family Medicine

## 2016-07-04 VITALS — BP 108/80 | HR 65 | Ht 64.0 in | Wt 224.7 lb

## 2016-07-04 DIAGNOSIS — N939 Abnormal uterine and vaginal bleeding, unspecified: Secondary | ICD-10-CM | POA: Diagnosis not present

## 2016-07-04 DIAGNOSIS — Z1151 Encounter for screening for human papillomavirus (HPV): Secondary | ICD-10-CM | POA: Insufficient documentation

## 2016-07-04 DIAGNOSIS — Z01419 Encounter for gynecological examination (general) (routine) without abnormal findings: Secondary | ICD-10-CM | POA: Insufficient documentation

## 2016-07-04 DIAGNOSIS — Z124 Encounter for screening for malignant neoplasm of cervix: Secondary | ICD-10-CM | POA: Diagnosis not present

## 2016-07-04 NOTE — Progress Notes (Signed)
   Subjective:    Patient ID: Katie Ross, female    DOB: 08-14-1986, 30 y.o.   MRN: [id number]  HPI Patient seen for follow up of menorrhagia and AUB.  She has been on Megace since 6/6, which has stopped her bleeding.  No side effects of medication.  Did have some bleeding when she didn't take it for 5 days, but started the medication again.    She also wants a PAP.   Review of Systems     Objective:   Physical Exam  Constitutional: She appears well-developed and well-nourished.  HENT:  Head: Normocephalic and atraumatic.  Abdominal: Soft. She exhibits no distension. There is no tenderness. There is no rebound and no guarding. Hernia confirmed negative in the right inguinal area and confirmed negative in the left inguinal area.  Genitourinary: There is no rash, tenderness, lesion or injury on the right labia. There is no rash, tenderness, lesion or injury on the left labia. Uterus is not deviated, not enlarged, not fixed and not tender. Cervix exhibits no motion tenderness, no discharge and no friability. Right adnexum displays no mass, no tenderness and no fullness. Left adnexum displays no mass, no tenderness and no fullness. No erythema, tenderness or bleeding in the vagina. No foreign body in the vagina. No signs of injury around the vagina. No vaginal discharge found.  Lymphadenopathy:       Right: No inguinal adenopathy present.       Left: No inguinal adenopathy present.  Skin: Skin is warm and dry.      Assessment & Plan:  1. Abnormal uterine bleeding (AUB) Discussed options: IUD vs OCP vs continuing megace.  Patient would like IUD.  Will schedule.  2. Pap smear for cervical cancer screening - Cytology - PAP

## 2016-07-04 NOTE — Patient Instructions (Signed)
Intrauterine Device Information An intrauterine device (IUD) is inserted into your uterus to prevent pregnancy. There are two types of IUDs available:   Copper IUD--This type of IUD is wrapped in copper wire and is placed inside the uterus. Copper makes the uterus and fallopian tubes produce a fluid that kills sperm. The copper IUD can stay in place for 10 years.  Hormone IUD--This type of IUD contains the hormone progestin (synthetic progesterone). The hormone thickens the cervical mucus and prevents sperm from entering the uterus. It also thins the uterine lining to prevent implantation of a fertilized egg. The hormone can weaken or kill the sperm that get into the uterus. One type of hormone IUD can stay in place for 5 years, and another type can stay in place for 3 years. Your health care provider will make sure you are a good candidate for a contraceptive IUD. Discuss with your health care provider the possible side effects.  ADVANTAGES OF AN INTRAUTERINE DEVICE  IUDs are highly effective, reversible, long acting, and low maintenance.   There are no estrogen-related side effects.   An IUD can be used when breastfeeding.   IUDs are not associated with weight gain.   The copper IUD works immediately after insertion.   The hormone IUD works right away if inserted within 7 days of your period starting. You will need to use a backup method of birth control for 7 days if the hormone IUD is inserted at any other time in your cycle.  The copper IUD does not interfere with your female hormones.   The hormone IUD can make heavy menstrual periods lighter and decrease cramping.   The hormone IUD can be used for 3 or 5 years.   The copper IUD can be used for 10 years. DISADVANTAGES OF AN INTRAUTERINE DEVICE  The hormone IUD can be associated with irregular bleeding patterns.   The copper IUD can make your menstrual flow heavier and more painful.   You may experience cramping and  vaginal bleeding after insertion.    This information is not intended to replace advice given to you by your health care provider. Make sure you discuss any questions you have with your health care provider.   Document Released: 10/01/2004 Document Revised: 06/30/2013 Document Reviewed: 04/18/2013 Elsevier Interactive Patient Education 2016 [employer]vier Inc[employer]  

## 2016-07-08 LAB — CYTOLOGY - PAP

## 2016-07-24 ENCOUNTER — Ambulatory Visit: Payer: Medicaid Other | Admitting: Family Medicine

## 2016-08-29 ENCOUNTER — Other Ambulatory Visit: Payer: Self-pay | Admitting: Family Medicine

## 2016-08-30 ENCOUNTER — Encounter: Payer: Self-pay | Admitting: Family Medicine

## 2016-09-06 ENCOUNTER — Other Ambulatory Visit: Payer: Self-pay | Admitting: Family Medicine

## 2016-09-06 ENCOUNTER — Ambulatory Visit
Admission: RE | Admit: 2016-09-06 | Discharge: 2016-09-06 | Disposition: A | Discharge status: Home / Self Care | Payer: Medicaid Other | Source: Ambulatory Visit | Attending: Family Medicine | Admitting: Family Medicine

## 2016-09-06 DIAGNOSIS — R7611 Nonspecific reaction to tuberculin skin test without active tuberculosis: Secondary | ICD-10-CM

## 2016-11-17 ENCOUNTER — Other Ambulatory Visit: Payer: Self-pay | Admitting: Family Medicine

## 2016-11-21 NOTE — Telephone Encounter (Signed)
[facility] called stating the patient is requesting a refill on megace. Medication refilled per Dr Adrian BlackwaterStinson, but need to inquire patient's long term plans for birth control. Called patient & informed her of refill and if she had thought about what she would like to do for the bleeding. Patient states she has an appt next week on the 18th. Told patient she can discuss her options at that appt. Patient had no questions

## 2016-11-28 ENCOUNTER — Ambulatory Visit: Payer: Medicaid Other | Admitting: Family Medicine

## 2016-12-16 ENCOUNTER — Ambulatory Visit (INDEPENDENT_AMBULATORY_CARE_PROVIDER_SITE_OTHER): Payer: Medicaid Other | Admitting: Family Medicine

## 2016-12-16 ENCOUNTER — Other Ambulatory Visit (HOSPITAL_COMMUNITY)
Admission: RE | Admit: 2016-12-16 | Discharge: 2016-12-16 | Disposition: A | Discharge status: Home / Self Care | Payer: Medicaid Other | Source: Ambulatory Visit | Attending: Family Medicine | Admitting: Family Medicine

## 2016-12-16 VITALS — BP 109/69 | HR 84 | Wt 226.3 lb

## 2016-12-16 DIAGNOSIS — Z113 Encounter for screening for infections with a predominantly sexual mode of transmission: Secondary | ICD-10-CM | POA: Insufficient documentation

## 2016-12-16 DIAGNOSIS — N939 Abnormal uterine and vaginal bleeding, unspecified: Secondary | ICD-10-CM

## 2016-12-16 NOTE — Progress Notes (Signed)
   Subjective:    Patient ID: Katie Ross, female    DOB: 06-20-1986, 31 y.o.   MRN: [id number]  HPI Patient seen for a KUB. Patient has been on Megace for approximately 6 months. She's been without bleeding during that time. She's been taking 80 mg once a day. At the last appointment, the patient had requested an IUD, but today the patient has changed her mind. She would like, all from hormones completely.  Additionally, the patient has concerns of STDs. Reports that her partner has possibly had other partners. Patient is asymptomatic.   Review of Systems     Objective:   Physical Exam  Constitutional: She appears well-developed and well-nourished.  Abdominal: Soft. She exhibits no distension and no mass. There is no tenderness. There is no rebound and no guarding.  Genitourinary: There is no rash, tenderness, lesion or injury on the right labia. There is no rash, tenderness, lesion or injury on the left labia. No erythema, tenderness or bleeding in the vagina. No foreign body in the vagina. No signs of injury around the vagina. No vaginal discharge found.  Skin: Skin is warm and dry.  Psychiatric: She has a normal mood and affect. Her behavior is normal. Judgment and thought content normal.      Assessment & Plan:  1. Abnormal uterine bleeding (AUB) Patient will stop Megace. Did offer IUD insertion, however patient declines. Discussed possibility of patient having withdrawal bleed, then should start cycles.  2. Screening examination for STD (sexually transmitted disease) - Hepatitis B Surface AntiGEN - HIV antibody (with reflex) - RPR - GC/Chlamydia probe amp ([place])not at [facility]

## 2016-12-17 LAB — HIV ANTIBODY (ROUTINE TESTING W REFLEX): HIV 1&2 Ab, 4th Generation: NONREACTIVE

## 2016-12-17 LAB — GC/CHLAMYDIA PROBE AMP (~~LOC~~) NOT AT ARMC
Chlamydia: NEGATIVE
NEISSERIA GONORRHEA: NEGATIVE

## 2016-12-17 LAB — HEPATITIS B SURFACE ANTIGEN: Hepatitis B Surface Ag: NEGATIVE

## 2016-12-17 LAB — RPR

## 2016-12-24 ENCOUNTER — Encounter: Payer: Self-pay | Admitting: Family Medicine

## 2016-12-25 ENCOUNTER — Other Ambulatory Visit: Payer: Self-pay | Admitting: Family Medicine

## 2016-12-25 MED ORDER — MEGESTROL ACETATE 40 MG PO TABS: 40.0000 mg | ORAL_TABLET | Freq: Two times a day (BID) | ORAL | 0 refills | Status: DC

## 2016-12-26 ENCOUNTER — Other Ambulatory Visit: Payer: Self-pay | Admitting: Family Medicine

## 2016-12-26 MED ORDER — MEGESTROL ACETATE 40 MG PO TABS: 40.0000 mg | ORAL_TABLET | Freq: Two times a day (BID) | ORAL | 2 refills | Status: DC

## 2017-01-20 ENCOUNTER — Emergency Department (HOSPITAL_COMMUNITY)
Admission: EM | Admit: 2017-01-20 | Discharge: 2017-01-20 | Disposition: A | Discharge status: Home / Self Care | Payer: Medicaid Other | Attending: Emergency Medicine | Admitting: Emergency Medicine

## 2017-01-20 ENCOUNTER — Encounter (HOSPITAL_COMMUNITY): Payer: Self-pay | Admitting: Emergency Medicine

## 2017-01-20 DIAGNOSIS — F17290 Nicotine dependence, other tobacco product, uncomplicated: Secondary | ICD-10-CM | POA: Diagnosis not present

## 2017-01-20 DIAGNOSIS — R1033 Periumbilical pain: Secondary | ICD-10-CM | POA: Diagnosis not present

## 2017-01-20 DIAGNOSIS — R109 Unspecified abdominal pain: Secondary | ICD-10-CM | POA: Diagnosis present

## 2017-01-20 NOTE — ED Provider Notes (Signed)
[facility]EM[facility]Y DEPT Provider Note   CSN: [id number] Arrival date & time: 01/20/17  0856     History   Chief Complaint Chief Complaint  Patient presents with  . Hernia    HPI Katie Ross is a 31 y.o. female who presents to the ED today with chief complaint of intermittent abdominal pain. She states that she had an umbilical hernia that was repaired 9 years ago and believes the hernia has returned. She states she sometimes has abdominal pain above her umbilicus, as well as an episode of nausea/vomiting every 2 weeks or so. She states she takes antacids and a PPI for her GERD-like symptoms with some relief. She states at times she feels "bulging" above her umbilicus but is unable to manually reduce the bulge like she was prior to her original hernia repair. She is not experiencing any pain, bulging, or other symptoms at this time.   She denies fever/chills, HA, dizziness, CP/SOB, hematemesis, dysuria, hematuria, melena, hematochezia, constipation/diarrhea.  The history is provided by the patient.    Past Medical History:  Diagnosis Date  . Anemia     Patient Active Problem List   Diagnosis Date Noted  . Menorrhagia 02/21/2016    Past Surgical History:  Procedure Laterality Date  . CESAREAN SECTION     x3  . HERNIA REPAIR      OB History    Gravida Para Term Preterm AB Living   3 3 3  0 0 3   SAB TAB Ectopic Multiple Live Births   0 0 0 0 3       Home Medications    Prior to Admission medications   Medication Sig Start Date End Date Taking? Authorizing Provider  megestrol (MEGACE) 40 MG tablet Take 1 tablet (40 mg total) by mouth 2 (two) times daily. 12/26/16   Levie Heritage, DO    Family History No family history on file.  Social History Social History  Substance Use Topics  . Smoking status: Current Every Day Smoker    Packs/day: 3.00    Types: Cigars  . Smokeless tobacco: Never Used  . Alcohol use Yes     Comment: occ     Allergies   Patient  has no known allergies.   Review of Systems Review of Systems  Constitutional: Negative for chills and fever.  HENT: Negative for sore throat and trouble swallowing.   Respiratory: Negative for shortness of breath.   Cardiovascular: Negative for chest pain.  Gastrointestinal: Positive for abdominal pain, nausea and vomiting. Negative for blood in stool, constipation and diarrhea.  Endocrine: Negative for polydipsia and polyphagia.  Genitourinary: Negative for dysuria and hematuria.  Neurological: Negative for dizziness, light-headedness and headaches.  Psychiatric/Behavioral: Negative for agitation and confusion.     Physical Exam Updated Vital Signs BP (!) 91/53   Pulse (!) 49   Temp 98.7 F (37.1 C) (Oral)   Resp 18   Ht 5\' 4"  (1.626 m)   Wt 102.5 kg   LMP  (LMP Unknown)   SpO2 100%   BMI 38.79 kg/m   Physical Exam  Constitutional: She is oriented to person, place, and time. She appears well-developed and well-nourished. No distress.  HENT:  Head: Normocephalic and atraumatic.  Eyes: Conjunctivae are normal. Right eye exhibits no discharge. Left eye exhibits no discharge. No scleral icterus.  Neck: Neck supple. No JVD present. No tracheal deviation present.  Cardiovascular: Normal rate, regular rhythm, normal heart sounds and intact distal pulses.   Pulmonary/Chest: Effort  normal and breath sounds normal.  Abdominal: Soft. Bowel sounds are normal. There is no tenderness. There is no guarding.  Well-healed surgical scar above umbilicus, no obvious bulging or deformity noted. Negative Murphy's, no TTP at McBurney's point.   Musculoskeletal: Normal range of motion. She exhibits no edema, tenderness or deformity.  Lymphadenopathy:    She has no cervical adenopathy.  Neurological: She is alert and oriented to person, place, and time.  Skin: Skin is warm and dry. Capillary refill takes less than 2 seconds. She is not diaphoretic.  Psychiatric: She has a normal mood and  affect. Her behavior is normal. Judgment and thought content normal.     ED Treatments / Results  Labs (all labs ordered are listed, but only abnormal results are displayed) Labs Reviewed - No data to display  EKG  EKG Interpretation None       Radiology No results found.  Procedures Procedures (including critical care time)  Medications Ordered in ED Medications - No data to display   Initial Impression / Assessment and Plan / ED Course  I have reviewed the triage vital signs and the nursing notes.  Pertinent labs & imaging results that were available during my care of the patient were reviewed by me and considered in my medical decision making (see chart for details).      30yof presents to ED today with history of intermittent abdominal pain with associated nausea and vomiting, which the pt attributes to what she thinks is a recurrence of her umbilical hernia. VSS, pt is in no apparent distress and does not complain of any discomfort or pain at this time. On exam, pt's abdomen is soft and nontender with no palpable superficial masses. Negative Murphy's sign, no tenderness to palpation of McBurney's point and pt overall well-appearing. Low suspicion for acute abdominal process such as appendicitis or perforation. Do not see a need for further emergent workup at this time. Will refer pt to general surgery for further evaluation of intermittent abdominal pain. Discussed with pt indications for return to ED including development of severe constant abdominal pain, fever/chills, and other signs of incarcerated hernia.   Pt seen and evaluated by Dr. Jeraldine LootsLockwood.  Final Clinical Impressions(s) / ED Diagnoses   Final diagnoses:  Periumbilical abdominal pain    New Prescriptions Discharge Medication List as of 01/20/2017 11:49 AM       Jeanie SewerMina A Maryagnes Carrasco, PA 01/20/17 [address]1233    Jeffey Janssen A BlancaFawze, GeorgiaPA 01/20/17 1234    Gerhard Munchobert Lockwood, MD 01/21/17 1544

## 2017-01-20 NOTE — ED Triage Notes (Signed)
Pt reports hx of abdominal hernia that she had surgically repaired 9 years ago, states she now has another hernia in the same place. Denies pain, states she just wants to get it fixed.

## 2017-02-03 ENCOUNTER — Other Ambulatory Visit: Payer: Self-pay | Admitting: Surgery

## 2017-02-03 DIAGNOSIS — K439 Ventral hernia without obstruction or gangrene: Secondary | ICD-10-CM

## 2017-02-06 ENCOUNTER — Other Ambulatory Visit (HOSPITAL_COMMUNITY)
Admission: RE | Admit: 2017-02-06 | Discharge: 2017-02-06 | Disposition: A | Discharge status: Home / Self Care | Payer: Medicaid Other | Source: Ambulatory Visit | Attending: Family Medicine | Admitting: Family Medicine

## 2017-02-06 ENCOUNTER — Ambulatory Visit (INDEPENDENT_AMBULATORY_CARE_PROVIDER_SITE_OTHER): Payer: Medicaid Other | Admitting: Family Medicine

## 2017-02-06 ENCOUNTER — Other Ambulatory Visit (HOSPITAL_COMMUNITY): Admission: RE | Admit: 2017-02-06 | Payer: Medicaid Other | Source: Ambulatory Visit | Admitting: Family Medicine

## 2017-02-06 VITALS — BP 112/71 | HR 59 | Ht 64.0 in | Wt 228.9 lb

## 2017-02-06 DIAGNOSIS — Z3202 Encounter for pregnancy test, result negative: Secondary | ICD-10-CM

## 2017-02-06 DIAGNOSIS — N939 Abnormal uterine and vaginal bleeding, unspecified: Secondary | ICD-10-CM | POA: Diagnosis not present

## 2017-02-06 LAB — POCT PREGNANCY, URINE: PREG TEST UR: NEGATIVE

## 2017-02-06 NOTE — Progress Notes (Signed)
Endometrial biopsy done for AUB - continued bleeding after cessation of progesterone suppression for 6 months.   ENDOMETRIAL BIOPSY     The indications for endometrial biopsy were reviewed.   Risks of the biopsy including cramping, bleeding, infection, uterine perforation, inadequate specimen and need for additional procedures  were discussed. The patient states she understands and agrees to undergo procedure today. Consent was signed. Time out was performed. Urine HCG was negative. A sterile speculum was placed in the patient's vagina and the cervix was prepped with Betadine. A single-toothed tenaculum was placed on the anterior lip of the cervix to stabilize it. The 3 mm pipelle was introduced into the endometrial cavity without difficulty to a depth of 9 cm, and a moderate amount of tissue was obtained and sent to pathology. The instruments were removed from the patient's vagina. Minimal bleeding from the cervix was noted. The patient tolerated the procedure well. Routine post-procedure instructions were given to the patient. The patient will follow up to review the results and for further management.

## 2017-02-10 ENCOUNTER — Ambulatory Visit
Admission: RE | Admit: 2017-02-10 | Discharge: 2017-02-10 | Disposition: A | Discharge status: Home / Self Care | Payer: Medicaid Other | Source: Ambulatory Visit | Attending: Surgery | Admitting: Surgery

## 2017-02-10 DIAGNOSIS — K439 Ventral hernia without obstruction or gangrene: Secondary | ICD-10-CM

## 2017-02-10 MED ORDER — IOPAMIDOL (ISOVUE-300) INJECTION 61%: 125.0000 mL | Freq: Once | INTRAVENOUS | Status: DC | PRN

## 2017-02-10 MED ORDER — IOPAMIDOL (ISOVUE-300) INJECTION 61%: 100.0000 mL | Freq: Once | INTRAVENOUS | Status: DC | PRN

## 2017-02-20 NOTE — Progress Notes (Signed)
Talked with patient - discussed normal Endometrial biopsy results. Discussed options: surgical vs medical. Pt would like IUD. Would like to leave in for about a year, then possibly come off and see if cycles return back to normal. Although she has her tubes tied, she is interested in a reversal and possibly having another baby, so does not want to do anything surgical at the point.

## 2017-04-11 ENCOUNTER — Ambulatory Visit: Payer: Medicaid Other | Admitting: Family Medicine

## 2017-04-23 ENCOUNTER — Emergency Department (HOSPITAL_COMMUNITY)
Admission: EM | Admit: 2017-04-23 | Discharge: 2017-04-23 | Disposition: A | Discharge status: Home / Self Care | Payer: Medicaid Other | Attending: Emergency Medicine | Admitting: Emergency Medicine

## 2017-04-23 ENCOUNTER — Encounter (HOSPITAL_COMMUNITY): Payer: Self-pay | Admitting: *Deleted

## 2017-04-23 DIAGNOSIS — W57XXXA Bitten or stung by nonvenomous insect and other nonvenomous arthropods, initial encounter: Secondary | ICD-10-CM | POA: Insufficient documentation

## 2017-04-23 DIAGNOSIS — L539 Erythematous condition, unspecified: Secondary | ICD-10-CM | POA: Insufficient documentation

## 2017-04-23 DIAGNOSIS — Z79899 Other long term (current) drug therapy: Secondary | ICD-10-CM | POA: Insufficient documentation

## 2017-04-23 DIAGNOSIS — F17290 Nicotine dependence, other tobacco product, uncomplicated: Secondary | ICD-10-CM | POA: Insufficient documentation

## 2017-04-23 MED ORDER — HYDROCORTISONE 1 % EX CREA: TOPICAL_CREAM | CUTANEOUS | 0 refills | Status: DC

## 2017-04-23 MED ORDER — CEPHALEXIN 500 MG PO CAPS: 500.0000 mg | ORAL_CAPSULE | Freq: Three times a day (TID) | ORAL | 0 refills | Status: DC

## 2017-04-23 NOTE — ED Triage Notes (Signed)
Pt reports having possible spider bite to left lower leg. Has large bump and redness noted. No warmth or swelling noted, no fever.

## 2017-04-23 NOTE — ED Provider Notes (Signed)
[facility] Provider Note   CSN: [id number] Arrival date & time: 04/23/17  1517  By signing my name below, I, Katie Ross, attest that this documentation has been prepared under the direction and in the presence of Red Bay HospitalJaime Syan Cullimore, PA-C. Electronically Signed: Freida Busmaniana Ross, Scribe. 04/23/2017. 4:56 PM.  History   Chief Complaint Chief Complaint  Patient presents with  . Abscess    The history is provided by the patient. No language interpreter was used.     HPI Comments:  Katie Ross is a 31 y.o. female who presents to the Emergency Department complaining of a moderately painful area of redness to the left lower leg which she first noticed 3 days ago. She describes burning to the site as well as itching. Pt believes she may have been bitten by a spider but did not see what bit her. She notes initially she just had a small red dot but it has gradually worsened since onset. She denies drainage from the site. No fever. Pt has no other acute complaints or associated symptoms at this time. No alleviating factors noted; no treatments tried PTA.    Past Medical History:  Diagnosis Date  . Anemia     Patient Active Problem List   Diagnosis Date Noted  . Menorrhagia 02/21/2016    Past Surgical History:  Procedure Laterality Date  . CESAREAN SECTION     x3  . HERNIA REPAIR      OB History    Gravida Para Term Preterm AB Living   3 3 3  0 0 3   SAB TAB Ectopic Multiple Live Births   0 0 0 0 3       Home Medications    Prior to Admission medications   Medication Sig Start Date End Date Taking? Authorizing Provider  cephALEXin (KEFLEX) 500 MG capsule Take 1 capsule (500 mg total) by mouth 3 (three) times daily. 04/23/17   Tomiko Schoon, Chase PicketJaime Pilcher, PA-C  hydrocortisone cream 1 % Apply to affected area 2 times daily 04/23/17   Granite Godman, Chase PicketJaime Pilcher, PA-C  megestrol (MEGACE) 40 MG tablet Take 1 tablet (40 mg total) by mouth 2 (two) times daily. 12/26/16   Levie HeritageStinson, Jacob J, DO     Family History History reviewed. No pertinent family history.  Social History Social History  Substance Use Topics  . Smoking status: Current Every Day Smoker    Packs/day: 3.00    Types: Cigars  . Smokeless tobacco: Never Used  . Alcohol use Yes     Comment: occ     Allergies   Patient has no known allergies.   Review of Systems Review of Systems  Constitutional: Negative for fever.  Skin: Positive for color change.    Physical Exam Updated Vital Signs BP 103/67 (BP Location: Left Arm)   Pulse 60   Temp 98.6 F (37 C) (Oral)   Resp 18   SpO2 100%   Physical Exam  Constitutional: She appears well-developed and well-nourished. No distress.  HENT:  Head: Normocephalic and atraumatic.  Neck: Neck supple.  Cardiovascular: Normal rate, regular rhythm and normal heart sounds.   No murmur heard. Pulmonary/Chest: Effort normal and breath sounds normal. No respiratory distress. She has no wheezes. She has no rales.  Musculoskeletal: Normal range of motion.  Neurological: She is alert.  LLE NVI  Skin: Skin is warm and dry.  Left lower extremity with 4x4 area of erythema and induration.  No fluctuance to suggest abscess.   Nursing note and vitals  reviewed.    ED Treatments / Results  DIAGNOSTIC STUDIES:  Oxygen Saturation is 100% on RA, normal by my interpretation.    COORDINATION OF CARE:  4:56 PM Discussed treatment plan with pt at bedside and pt agreed to plan.  Labs (all labs ordered are listed, but only abnormal results are displayed) Labs Reviewed - No data to display  EKG  EKG Interpretation None       Radiology No results found.  Procedures Procedures (including critical care time)  Medications Ordered in ED Medications - No data to display   Initial Impression / Assessment and Plan / ED Course  I have reviewed the triage vital signs and the nursing notes.  Pertinent labs & imaging results that were available during my care of  the patient were reviewed by me and considered in my medical decision making (see chart for details).    Katie Ross is a 31 y.o. female who presents to ED for area of erythema to LLE x 3 days after she believes she was bitten by insect. Exam c/w insect bite, however is concerning for early cellulitis. She is afebrile, hemodynamically stable with no fluctuance to suggest abscess. Will treat with keflex and cortisone cream. Discussed reasons to return to ER with patient as well as home care instructions. All questions answered.   Final Clinical Impressions(s) / ED Diagnoses   Final diagnoses:  Insect bite, initial encounter    New Prescriptions New Prescriptions   CEPHALEXIN (KEFLEX) 500 MG CAPSULE    Take 1 capsule (500 mg total) by mouth 3 (three) times daily.   HYDROCORTISONE CREAM 1 %    Apply to affected area 2 times daily   I personally performed the services described in this documentation, which was scribed in my presence. The recorded information has been reviewed and is accurate.     Detra Bores, Chase Picket, PA-C 04/23/17 1720    Gerhard Munch, MD 04/24/17 1229

## 2017-04-23 NOTE — Discharge Instructions (Signed)
It was my pleasure taking care of you today!   Please take all of your antibiotics until finished!  Topical hydrocortisone cream to affected area twice daily.   Return to ER for new or worsening symptoms, any additional concerns.

## 2017-05-05 ENCOUNTER — Ambulatory Visit (INDEPENDENT_AMBULATORY_CARE_PROVIDER_SITE_OTHER): Payer: Medicaid Other | Admitting: Obstetrics and Gynecology

## 2017-05-05 ENCOUNTER — Encounter: Payer: Self-pay | Admitting: Obstetrics and Gynecology

## 2017-05-05 DIAGNOSIS — Z3202 Encounter for pregnancy test, result negative: Secondary | ICD-10-CM

## 2017-05-05 LAB — POCT PREGNANCY, URINE: PREG TEST UR: NEGATIVE

## 2017-05-05 NOTE — Progress Notes (Signed)
Pt was here today for IUD insertion for bleeding.  Pt reported having unprotected sex yesterday and the week before.  Notified Dr. Alysia PennaErvin and was informed that pt should abstain or use condoms for the next two weeks.  Reschedule IUD appt in two weeks in which another pregnancy test will be done and if negative will insert IUD with her abstaining or using condoms.  Pt stated understanding with no further questions.

## 2017-05-21 ENCOUNTER — Ambulatory Visit: Payer: Medicaid Other | Admitting: Advanced Practice Midwife

## 2017-06-16 ENCOUNTER — Encounter: Payer: Self-pay | Admitting: *Deleted

## 2017-06-16 ENCOUNTER — Ambulatory Visit: Payer: Medicaid Other | Admitting: Obstetrics & Gynecology

## 2017-06-16 NOTE — Progress Notes (Signed)
Katie Ross did not keep her scheduled appointment for pregnancy test and iud insertion.  No need to call, may reschedule if she calls.

## 2017-11-19 ENCOUNTER — Other Ambulatory Visit: Payer: Self-pay | Admitting: Family Medicine

## 2017-12-29 ENCOUNTER — Other Ambulatory Visit: Payer: Self-pay | Admitting: Family Medicine

## 2018-02-13 ENCOUNTER — Other Ambulatory Visit: Payer: Self-pay | Admitting: Family Medicine

## 2018-03-30 ENCOUNTER — Ambulatory Visit (INDEPENDENT_AMBULATORY_CARE_PROVIDER_SITE_OTHER): Payer: Medicaid Other | Admitting: Obstetrics and Gynecology

## 2018-03-30 ENCOUNTER — Encounter: Payer: Self-pay | Admitting: Obstetrics and Gynecology

## 2018-03-30 VITALS — BP 103/39 | HR 64 | Resp 16 | Ht 64.0 in | Wt 243.6 lb

## 2018-03-30 DIAGNOSIS — Z0000 Encounter for general adult medical examination without abnormal findings: Secondary | ICD-10-CM

## 2018-03-30 DIAGNOSIS — R300 Dysuria: Secondary | ICD-10-CM

## 2018-03-30 DIAGNOSIS — N921 Excessive and frequent menstruation with irregular cycle: Secondary | ICD-10-CM

## 2018-03-30 DIAGNOSIS — Z01419 Encounter for gynecological examination (general) (routine) without abnormal findings: Secondary | ICD-10-CM | POA: Insufficient documentation

## 2018-03-30 MED ORDER — MEGESTROL ACETATE 40 MG PO TABS: 40.0000 mg | ORAL_TABLET | Freq: Every day | ORAL | 11 refills | Status: DC

## 2018-03-30 NOTE — Progress Notes (Signed)
GYNECOLOGY CLINIC ANNUAL PREVENTATIVE CARE ENCOUNTER NOTE  Subjective:   Katie Ross is a 32 y.o. [id number] female here for a routine annual gynecologic exam. She has a history of AUB. W/U includes a normal EMBX. Has been taking Megace. Bleeding pattern is better when she can remember to take. Has tried OCP's in the past without relief. She does not want an IUD. H/O BTL and is considering reversal.   She reports dysuria. Sexual active without problems  Gynecologic History Patient's last menstrual period was 03/16/2018 (approximate). Contraception: tubal ligation Last Pap: 8/17. Results were: normal Last mammogram: NA.  Obstetric History OB History  Gravida Para Term Preterm AB Living  0 0 3  SAB TAB Ectopic Multiple Live Births  0 0 0 0 3    # Outcome Date GA Lbr Len/2nd Weight Sex Delivery Anes PTL Lv  3 Term      CS-LTranv   LIV  2 Term      CS-LTranv   LIV  1 Term      CS-LTranv   LIV    Past Medical History:  Diagnosis Date  . Anemia     Past Surgical History:  Procedure Laterality Date  . CESAREAN SECTION     x3  . HERNIA REPAIR      Current Outpatient Medications on File Prior to Visit  Medication Sig Dispense Refill  . citalopram (CELEXA) 10 MG tablet Take 1 tablet by mouth at bedtime.  1  . lamoTRIgine (LAMICTAL) 25 MG tablet Take 1 tablet by mouth 2 (two) times daily.    Marland Kitchen LATUDA 40 MG TABS tablet Take 1 tablet by mouth at bedtime.  1   No current facility-administered medications on file prior to visit.     No Known Allergies  Social History   Socioeconomic History  . Marital status: Married    Spouse name: Not on file  . Number of children: Not on file  . Years of education: Not on file  . Highest education level: Not on file  Occupational History  . Not on file  Social Needs  . Financial resource strain: Not on file  . Food insecurity:    Worry: Not on file    Inability: Not on file  . Transportation needs:    Medical: Not on  file    Non-medical: Not on file  Tobacco Use  . Smoking status: Current Every Day Smoker    Packs/day: 3.00    Types: Cigars  . Smokeless tobacco: Never Used  Substance and Sexual Activity  . Alcohol use: Yes    Comment: occ  . Drug use: No  . Sexual activity: Yes    Birth control/protection: None  Lifestyle  . Physical activity:    Days per week: Not on file    Minutes per session: Not on file  . Stress: Not on file  Relationships  . Social connections:    Talks on phone: Not on file    Gets together: Not on file    Attends religious service: Not on file    Active member of club or organization: Not on file    Attends meetings of clubs or organizations: Not on file    Relationship status: Not on file  . Intimate partner violence:    Fear of current or ex partner: Not on file    Emotionally abused: Not on file    Physically abused: Not on file    Forced sexual activity:  Not on file  Other Topics Concern  . Not on file  Social History Narrative  . Not on file    History reviewed. No pertinent family history.  The following portions of the patient's history were reviewed and updated as appropriate: allergies, current medications, past family history, past medical history, past social history, past surgical history and problem list.  Review of Systems Pertinent items noted in HPI and remainder of comprehensive ROS otherwise negative.   Objective:  BP (!) 103/39 (BP Location: Right Arm, Patient Position: Sitting, Cuff Size: Large)   Pulse 64   Resp 16   Ht  (1.626 m)   Wt 110.5 kg (243 lb 9.6 oz)   LMP 03/16/2018 (Approximate)   BMI 41.81 kg/m  CONSTITUTIONAL: Well-developed, well-nourished female in no acute distress.  HENT:  Normocephalic, atraumatic, External right and left ear normal. Oropharynx is clear and moist EYES: Conjunctivae and EOM are normal. Pupils are equal, round, and reactive to light. No scleral icterus.  NECK: Normal range of motion,  supple, no masses.  Normal thyroid.  SKIN: Skin is warm and dry. No rash noted. Not diaphoretic. No erythema. No pallor. NEUROLGIC: Alert and oriented to person, place, and time. Normal reflexes, muscle tone coordination. No cranial nerve deficit noted. PSYCHIATRIC: Normal mood and affect. Normal behavior. Normal judgment and thought content. CARDIOVASCULAR: Normal heart rate noted, regular rhythm RESPIRATORY: Clear to auscultation bilaterally. Effort and breath sounds normal, no problems with respiration noted. BREASTS: Symmetric in size. No masses, skin changes, nipple drainage, or lymphadenopathy. ABDOMEN: Soft, normal bowel sounds, no distention noted.  No tenderness, rebound or guarding.  PELVIC: Normal appearing external genitalia; normal appearing vaginal mucosa and cervix.  No abnormal discharge noted.  Pap smear obtained.  Normal uterine size, no other palpable masses, no uterine or adnexal tenderness. MUSCULOSKELETAL: Normal range of motion. No tenderness.  No cyanosis, clubbing, or edema.  2+ distal pulses.   Assessment:  Annual gynecologic examination with pap smear  AUB Dysuria Plan:  Will follow up results of pap smear and manage accordingly.UC today. Discussed medical treatment options for pt's AUB. She desires to continue with Megace for now.  Routine preventative health maintenance measures emphasized. Please refer to After Visit Summary for other counseling recommendations.    Nettie Elm MD, FACOG Attending Obstetrician & Gynecologist Center for [facility], [facility]

## 2018-03-30 NOTE — Patient Instructions (Signed)

## 2018-04-01 LAB — URINE CULTURE

## 2018-06-08 ENCOUNTER — Encounter (HOSPITAL_COMMUNITY): Payer: Self-pay | Admitting: Emergency Medicine

## 2018-06-08 ENCOUNTER — Emergency Department (HOSPITAL_COMMUNITY)
Admission: EM | Admit: 2018-06-08 | Discharge: 2018-06-08 | Disposition: A | Discharge status: Home / Self Care | Payer: Medicaid Other | Attending: Emergency Medicine | Admitting: Emergency Medicine

## 2018-06-08 ENCOUNTER — Other Ambulatory Visit: Payer: Self-pay

## 2018-06-08 DIAGNOSIS — F17290 Nicotine dependence, other tobacco product, uncomplicated: Secondary | ICD-10-CM | POA: Insufficient documentation

## 2018-06-08 DIAGNOSIS — Z79899 Other long term (current) drug therapy: Secondary | ICD-10-CM | POA: Insufficient documentation

## 2018-06-08 DIAGNOSIS — N12 Tubulo-interstitial nephritis, not specified as acute or chronic: Secondary | ICD-10-CM | POA: Insufficient documentation

## 2018-06-08 DIAGNOSIS — R300 Dysuria: Secondary | ICD-10-CM | POA: Diagnosis present

## 2018-06-08 LAB — POC URINE PREG, ED: PREG TEST UR: NEGATIVE

## 2018-06-08 LAB — URINALYSIS, ROUTINE W REFLEX MICROSCOPIC
BILIRUBIN URINE: NEGATIVE
Glucose, UA: NEGATIVE mg/dL
Ketones, ur: NEGATIVE mg/dL
NITRITE: NEGATIVE
Protein, ur: 30 mg/dL — AB
SPECIFIC GRAVITY, URINE: 1.009 (ref 1.005–1.030)
WBC, UA: >50 WBC/hpf — ABNORMAL HIGH (ref 0–5)
pH: 6 (ref 5.0–8.0)

## 2018-06-08 MED ORDER — OXYCODONE-ACETAMINOPHEN 5-325 MG PO TABS
1.0000 | ORAL_TABLET | Freq: Once | ORAL | Status: AC
  Administered 2018-06-08: 1 via ORAL
  Filled 2018-06-08: qty 1

## 2018-06-08 MED ORDER — CEPHALEXIN 500 MG PO CAPS: 500.0000 mg | ORAL_CAPSULE | Freq: Three times a day (TID) | ORAL | 0 refills | Status: DC

## 2018-06-08 MED ORDER — FLUCONAZOLE 150 MG PO TABS: 150.0000 mg | ORAL_TABLET | Freq: Every day | ORAL | 0 refills | Status: AC

## 2018-06-08 NOTE — ED Triage Notes (Signed)
Pt. Stated, Im hurting when I pee for the last week.

## 2018-06-08 NOTE — Discharge Instructions (Signed)
Please read attached information. If you experience any new or worsening signs or symptoms please return to the emergency room for evaluation. Please follow-up with your primary care provider or specialist as discussed. Please use medication prescribed only as directed and discontinue taking if you have any concerning signs or symptoms.   °

## 2018-06-08 NOTE — ED Notes (Signed)
Patient verbalizes understanding of discharge instructions. Opportunity for questioning and answers were provided. Armband removed by staff, pt discharged from ED ambulatory.   

## 2018-06-08 NOTE — ED Provider Notes (Signed)
[facility] Provider Note   CSN: [id number] Arrival date & time: 06/08/18  [id number]   History   Chief Complaint Chief Complaint  Patient presents with  . Dysuria    HPI Katie Ross is a 32 y.o. female.  HPI   32 year old female presents today with complaints of dysuria. Patient notes proximal knee one week ago she developed dysuria and suprapubic discomfort. She notes this has developed into right flank pain. She denies any fever nausea vomiting, or upper abdominal pain. Patient notes history of diabetes, no recent urinary tract infections or antibiotics. She denies any vaginal discharge. She notes she is taking Megace occasionally for vaginal bleeding.       Past Medical History:  Diagnosis Date  . Anemia     Patient Active Problem List   Diagnosis Date Noted  . Visit for routine gyn exam 03/30/2018  . Dysuria 03/30/2018  . Menorrhagia 02/21/2016    Past Surgical History:  Procedure Laterality Date  . CESAREAN SECTION     x3  . HERNIA REPAIR       OB History    Gravida  3   Para  3   Term  3   Preterm  0   AB  0   Living  3     SAB  0   TAB  0   Ectopic  0   Multiple  0   Live Births  3            Home Medications    Prior to Admission medications   Medication Sig Start Date End Date Taking? Authorizing Provider  cephALEXin (KEFLEX) 500 MG capsule Take 1 capsule (500 mg total) by mouth 3 (three) times daily. 06/08/18   Johnanna Bakke, Tinnie GensJeffrey, PA-C  citalopram (CELEXA) 10 MG tablet Take 1 tablet by mouth at bedtime. 02/20/18   [provider]  fluconazole (DIFLUCAN) 150 MG tablet Take 1 tablet (150 mg total) by mouth daily for 1 day. 06/08/18 06/09/18  Jamey Demchak, Tinnie GensJeffrey, PA-C  lamoTRIgine (LAMICTAL) 25 MG tablet Take 1 tablet by mouth 2 (two) times daily. 10/25/16   [provider]  LATUDA 40 MG TABS tablet Take 1 tablet by mouth at bedtime. 02/20/18   [provider]  megestrol  (MEGACE) 40 MG tablet Take 1 tablet (40 mg total) by mouth daily. 03/30/18   Hermina StaggersErvin, Michael L, MD    Family History No family history on file.  Social History Social History   Tobacco Use  . Smoking status: Current Every Day Smoker    Packs/day: 3.00    Types: Cigars  . Smokeless tobacco: Never Used  Substance Use Topics  . Alcohol use: Yes    Comment: occ  . Drug use: No     Allergies   Patient has no known allergies.   Review of Systems Review of Systems  All other systems reviewed and are negative.    Physical Exam Updated Vital Signs BP 135/86 (BP Location: Right Arm)   Pulse 80   Temp 99.2 F (37.3 C) (Oral)   Resp 18   Ht 5\' 4"  (1.626 m)   Wt 104.3 kg (230 lb)   SpO2 95%   BMI 39.48 kg/m   Physical Exam  Constitutional: She is oriented to person, place, and time. She appears well-developed and well-nourished.  HENT:  Head: Normocephalic and atraumatic.  Eyes: Pupils are equal, round, and reactive to light. Conjunctivae are normal. Right eye exhibits no discharge. Left  eye exhibits no discharge. No scleral icterus.  Neck: Normal range of motion. No JVD present. No tracheal deviation present.  Pulmonary/Chest: Effort normal. No stridor.  Abdominal: Soft. She exhibits no distension and no mass. There is tenderness. There is no rebound. No hernia.  Tenderness palpation in suprapubic region, remainder of abdomen soft nontender;  right-sided CVA tenderness  Neurological: She is alert and oriented to person, place, and time. Coordination normal.  Psychiatric: She has a normal mood and affect. Her behavior is normal. Judgment and thought content normal.  Nursing note and vitals reviewed.    ED Treatments / Results  Labs (all labs ordered are listed, but only abnormal results are displayed) Labs Reviewed  URINALYSIS, ROUTINE W REFLEX MICROSCOPIC - Abnormal; Notable for the following components:      Result Value   APPearance CLOUDY (*)    Hgb urine  dipstick MODERATE (*)    Protein, ur 30 (*)    Leukocytes, UA LARGE (*)    WBC, UA >50 (*)    Bacteria, UA MANY (*)    All other components within normal limits  URINE CULTURE  POC URINE PREG, ED    EKG None  Radiology No results found.  Procedures Procedures (including critical care time)  Medications Ordered in ED Medications - No data to display   Initial Impression / Assessment and Plan / ED Course  I have reviewed the triage vital signs and the nursing notes.  Pertinent labs & imaging results that were available during my care of the patient were reviewed by me and considered in my medical decision making (see chart for details).     Labs: point care urine pregnant, urinalysis, urine culture   Imaging:  Consults:  Therapeutics:  Discharge Meds:  Keflex   Assessment/Plan: 32 year old female presents today with urinary tract infection with likely pyelonephritis. Low suspicion for other acute intra-abdominal pathology including sinusitis, diverticulitis, PID. She is well-appearing and in acute distress no nausea or vomiting, no fever, no elevated heart rate. Patient candidate for outpatient antibiotic therapy. She'll be treated with Keflex, she will return immediately if she develops any new or worsening signs or symptoms or symptoms do not improve within 48 hours. The patient verbalized understanding and agreement to today's plan had no further questions concerns at time of discharge.      Final Clinical Impressions(s) / ED Diagnoses   Final diagnoses:  Pyelonephritis    ED Discharge Orders        Ordered    cephALEXin (KEFLEX) 500 MG capsule  3 times daily     06/08/18 1215    fluconazole (DIFLUCAN) 150 MG tablet  Daily     06/08/18 1215       Eyvonne Mechanic, PA-C 06/08/18 1217    Sabas Sous, MD 06/09/18 0001

## 2018-06-11 LAB — URINE CULTURE: Culture: >=100000 — AB

## 2018-06-12 ENCOUNTER — Telehealth: Payer: Self-pay | Admitting: *Deleted

## 2018-06-12 NOTE — Telephone Encounter (Signed)
Post ED Visit - Positive Culture Follow-up  Culture report reviewed by antimicrobial stewardship pharmacist:  []  Enzo BiNathan Batchelder, Pharm.D. []  Celedonio MiyamotoJeremy Frens, Pharm.D., BCPS AQ-ID []  Garvin FilaMike Maccia, Pharm.D., BCPS []  Georgina PillionElizabeth Martin, Pharm.D., BCPS []  AlbanyMinh Pham, VermontPharm.D., BCPS, AAHIVP []  Estella HuskMichelle Turner, Pharm.D., BCPS, AAHIVP []  Lysle Pearlachel Rumbarger, PharmD, BCPS []  Phillips Climeshuy Dang, PharmD, BCPS []  Agapito GamesAlison Masters, PharmD, BCPS []  Verlan FriendsErin Deja, PharmD Harlow MaresAmy Thompson, PharmD  Positive urine culture Treated with Cephlaexin, organism sensitive to the same and no further patient follow-up is required at this time.  Virl AxeRobertson, Antrell Tipler [facility] 06/12/2018, 11:43 AM

## 2019-06-03 ENCOUNTER — Ambulatory Visit
Admission: RE | Admit: 2019-06-03 | Discharge: 2019-06-03 | Disposition: A | Discharge status: Home / Self Care | Payer: Medicaid Other | Source: Ambulatory Visit | Attending: Nurse Practitioner | Admitting: Nurse Practitioner

## 2019-06-03 ENCOUNTER — Other Ambulatory Visit: Payer: Self-pay | Admitting: Nurse Practitioner

## 2019-06-03 DIAGNOSIS — M545 Low back pain, unspecified: Secondary | ICD-10-CM

## 2019-06-21 ENCOUNTER — Ambulatory Visit (HOSPITAL_COMMUNITY): Admission: RE | Admit: 2019-06-21 | Payer: Medicaid Other | Source: Ambulatory Visit

## 2019-06-21 ENCOUNTER — Other Ambulatory Visit (HOSPITAL_COMMUNITY): Payer: Self-pay | Admitting: Nurse Practitioner

## 2019-06-21 DIAGNOSIS — M79606 Pain in leg, unspecified: Secondary | ICD-10-CM

## 2019-06-28 ENCOUNTER — Other Ambulatory Visit: Payer: Self-pay

## 2019-06-28 ENCOUNTER — Ambulatory Visit (HOSPITAL_COMMUNITY)
Admission: RE | Admit: 2019-06-28 | Discharge: 2019-06-28 | Disposition: A | Discharge status: Home / Self Care | Payer: Medicaid Other | Source: Ambulatory Visit | Attending: Nurse Practitioner | Admitting: Nurse Practitioner

## 2019-06-28 DIAGNOSIS — M79606 Pain in leg, unspecified: Secondary | ICD-10-CM | POA: Diagnosis not present

## 2019-06-28 DIAGNOSIS — M7989 Other specified soft tissue disorders: Secondary | ICD-10-CM | POA: Diagnosis not present

## 2019-06-28 NOTE — Progress Notes (Signed)
Bilateral lower extremity venous duplex completed. Preliminary results in Chart review CV Proc. [facility], [place] 06/28/2019, 5:37 PM

## 2019-07-15 ENCOUNTER — Ambulatory Visit (INDEPENDENT_AMBULATORY_CARE_PROVIDER_SITE_OTHER): Payer: Medicaid Other | Admitting: Obstetrics and Gynecology

## 2019-07-15 ENCOUNTER — Encounter: Payer: Self-pay | Admitting: Obstetrics and Gynecology

## 2019-07-15 ENCOUNTER — Other Ambulatory Visit: Payer: Self-pay

## 2019-07-15 ENCOUNTER — Other Ambulatory Visit (HOSPITAL_COMMUNITY)
Admission: RE | Admit: 2019-07-15 | Discharge: 2019-07-15 | Disposition: A | Discharge status: Home / Self Care | Payer: Medicaid Other | Source: Ambulatory Visit | Attending: Obstetrics and Gynecology | Admitting: Obstetrics and Gynecology

## 2019-07-15 VITALS — BP 142/103 | HR 68 | Wt 281.0 lb

## 2019-07-15 DIAGNOSIS — N920 Excessive and frequent menstruation with regular cycle: Secondary | ICD-10-CM

## 2019-07-15 DIAGNOSIS — Z202 Contact with and (suspected) exposure to infections with a predominantly sexual mode of transmission: Secondary | ICD-10-CM | POA: Insufficient documentation

## 2019-07-15 DIAGNOSIS — Z0000 Encounter for general adult medical examination without abnormal findings: Secondary | ICD-10-CM | POA: Diagnosis not present

## 2019-07-15 DIAGNOSIS — Z01419 Encounter for gynecological examination (general) (routine) without abnormal findings: Secondary | ICD-10-CM

## 2019-07-15 NOTE — Patient Instructions (Signed)
Health Maintenance, Female Adopting a healthy lifestyle and getting preventive care are important in promoting health and wellness. Ask your health care provider about:  The right schedule for you to have regular tests and exams.  Things you can do on your own to prevent diseases and keep yourself healthy. What should I know about diet, weight, and exercise? Eat a healthy diet   Eat a diet that includes plenty of vegetables, fruits, low-fat dairy products, and lean protein.  Do not eat a lot of foods that are high in solid fats, added sugars, or sodium. Maintain a healthy weight Body mass index (BMI) is used to identify weight problems. It estimates body fat based on height and weight. Your health care provider can help determine your BMI and help you achieve or maintain a healthy weight. Get regular exercise Get regular exercise. This is one of the most important things you can do for your health. Most adults should:  Exercise for at least 150 minutes each week. The exercise should increase your heart rate and make you sweat (moderate-intensity exercise).  Do strengthening exercises at least twice a week. This is in addition to the moderate-intensity exercise.  Spend less time sitting. Even light physical activity can be beneficial. Watch cholesterol and blood lipids Have your blood tested for lipids and cholesterol at 35 years of age, then have this test every 5 years. Have your cholesterol levels checked more often if:  Your lipid or cholesterol levels are high.  You are older than 35 years of age.  You are at high risk for heart disease. What should I know about cancer screening? Depending on your health history and family history, you may need to have cancer screening at various ages. This may include screening for:  Breast cancer.  Cervical cancer.  Colorectal cancer.  Skin cancer.  Lung cancer. What should I know about heart disease, diabetes, and high blood  pressure? Blood pressure and heart disease  High blood pressure causes heart disease and increases the risk of stroke. This is more likely to develop in people who have high blood pressure readings, are of African descent, or are overweight.  Have your blood pressure checked: ? Every 3-5 years if you are 18-39 years of age. ? Every year if you are 40 years old or older. Diabetes Have regular diabetes screenings. This checks your fasting blood sugar level. Have the screening done:  Once every three years after age 40 if you are at a normal weight and have a low risk for diabetes.  More often and at a younger age if you are overweight or have a high risk for diabetes. What should I know about preventing infection? Hepatitis B If you have a higher risk for hepatitis B, you should be screened for this virus. Talk with your health care provider to find out if you are at risk for hepatitis B infection. Hepatitis C Testing is recommended for:  Everyone born from 1945 through 1965.  Anyone with known risk factors for hepatitis C. Sexually transmitted infections (STIs)  Get screened for STIs, including gonorrhea and chlamydia, if: ? You are sexually active and are younger than 35 years of age. ? You are older than 35 years of age and your health care provider tells you that you are at risk for this type of infection. ? Your sexual activity has changed since you were last screened, and you are at increased risk for chlamydia or gonorrhea. Ask your health care provider if   you are at risk.  Ask your health care provider about whether you are at high risk for HIV. Your health care provider may recommend a prescription medicine to help prevent HIV infection. If you choose to take medicine to prevent HIV, you should first get tested for HIV. You should then be tested every 3 months for as long as you are taking the medicine. Pregnancy  If you are about to stop having your period (premenopausal) and  you may become pregnant, seek counseling before you get pregnant.  Take 400 to 800 micrograms (mcg) of folic acid every day if you become pregnant.  Ask for birth control (contraception) if you want to prevent pregnancy. Osteoporosis and menopause Osteoporosis is a disease in which the bones lose minerals and strength with aging. This can result in bone fractures. If you are 65 years old or older, or if you are at risk for osteoporosis and fractures, ask your health care provider if you should:  Be screened for bone loss.  Take a calcium or vitamin D supplement to lower your risk of fractures.  Be given hormone replacement therapy (HRT) to treat symptoms of menopause. Follow these instructions at home: Lifestyle  Do not use any products that contain nicotine or tobacco, such as cigarettes, e-cigarettes, and chewing tobacco. If you need help quitting, ask your health care provider.  Do not use street drugs.  Do not share needles.  Ask your health care provider for help if you need support or information about quitting drugs. Alcohol use  Do not drink alcohol if: ? Your health care provider tells you not to drink. ? You are pregnant, may be pregnant, or are planning to become pregnant.  If you drink alcohol: ? Limit how much you use to 0-1 drink a day. ? Limit intake if you are breastfeeding.  Be aware of how much alcohol is in your drink. In the [place] one drink equals one 12 oz bottle of beer (355 mL), one 5 oz glass of wine (148 mL), or one 1 oz glass of hard liquor (44 mL). General instructions  Schedule regular health, dental, and eye exams.  Stay current with your vaccines.  Tell your health care provider if: ? You often feel depressed. ? You have ever been abused or do not feel safe at home. Summary  Adopting a healthy lifestyle and getting preventive care are important in promoting health and wellness.  Follow your health care provider's instructions about healthy  diet, exercising, and getting tested or screened for diseases.  Follow your health care provider's instructions on monitoring your cholesterol and blood pressure. This information is not intended to replace advice given to you by your health care provider. Make sure you discuss any questions you have with your health care provider. Document Released: 05/13/2011 Document Revised: 10/21/2018 Document Reviewed: 10/21/2018 Elsevier Patient Education  2020 Elsevier Inc.  

## 2019-07-15 NOTE — Progress Notes (Signed)
Katie Ross is a 33 y.o. G43P3003 female here for a routine annual gynecologic exam. She is still taking her Megace when she can remember. Helps with bleeding but would like to do something different. Seeing PCP about her BP.     Gynecologic History No LMP recorded. (Menstrual status: Irregular Periods). Contraception: tubal ligation Last Pap: 2019. Results were: normal Last mammogram: NA.   Obstetric History OB History  Gravida Para Term Preterm AB Living  3 3 3  0 0 3  SAB TAB Ectopic Multiple Live Births  0 0 0 0 3    # Outcome Date GA Lbr Len/2nd Weight Sex Delivery Anes PTL Lv  3 Term      CS-LTranv   LIV  2 Term      CS-LTranv   LIV  1 Term      CS-LTranv   LIV    Past Medical History:  Diagnosis Date  . Anemia     Past Surgical History:  Procedure Laterality Date  . CESAREAN SECTION     x3  . HERNIA REPAIR      Current Outpatient Medications on File Prior to Visit  Medication Sig Dispense Refill  . cephALEXin (KEFLEX) 500 MG capsule Take 1 capsule (500 mg total) by mouth 3 (three) times daily. 30 capsule 0  . citalopram (CELEXA) 10 MG tablet Take 1 tablet by mouth at bedtime.  1  . lamoTRIgine (LAMICTAL) 25 MG tablet Take 1 tablet by mouth 2 (two) times daily.    Marland Kitchen LATUDA 40 MG TABS tablet Take 1 tablet by mouth at bedtime.  1  . megestrol (MEGACE) 40 MG tablet Take 1 tablet (40 mg total) by mouth daily. 30 tablet 11   No current facility-administered medications on file prior to visit.     No Known Allergies  Social History   Socioeconomic History  . Marital status: Married    Spouse name: Not on file  . Number of children: Not on file  . Years of education: Not on file  . Highest education level: Not on file  Occupational History  . Not on file  Social Needs  . Financial resource strain: Not on file  . Food insecurity    Worry: Not on file    Inability: Not on file  . Transportation needs    Medical: Not on file    Non-medical: Not on file   Tobacco Use  . Smoking status: Current Every Day Smoker    Packs/day: 3.00    Types: Cigars  . Smokeless tobacco: Never Used  Substance and Sexual Activity  . Alcohol use: Yes    Comment: occ  . Drug use: No  . Sexual activity: Yes    Birth control/protection: None  Lifestyle  . Physical activity    Days per week: Not on file    Minutes per session: Not on file  . Stress: Not on file  Relationships  . Social Herbalist on phone: Not on file    Gets together: Not on file    Attends religious service: Not on file    Active member of club or organization: Not on file    Attends meetings of clubs or organizations: Not on file    Relationship status: Not on file  . Intimate partner violence    Fear of current or ex partner: Not on file    Emotionally abused: Not on file    Physically abused: Not on file  Forced sexual activity: Not on file  Other Topics Concern  . Not on file  Social History Narrative  . Not on file    History reviewed. No pertinent family history.  The following portions of the patient's history were reviewed and updated as appropriate: allergies, current medications, past family history, past medical history, past social history, past surgical history and problem list.  Review of Systems Pertinent items noted in HPI and remainder of comprehensive ROS otherwise negative.   Objective:  BP (!) 142/103   Pulse 68   Wt 281 lb (127.5 kg)   BMI 48.23 kg/m  CONSTITUTIONAL: Well-developed, well-nourished female in no acute distress.  HENT:  Normocephalic, atraumatic, External right and left ear normal. Oropharynx is clear and moist EYES: Conjunctivae and EOM are normal. Pupils are equal, round, and reactive to light. No scleral icterus.  NECK: Normal range of motion, supple, no masses.  Normal thyroid.  SKIN: Skin is warm and dry. No rash noted. Not diaphoretic. No erythema. No pallor. NEUROLGIC: Alert and oriented to person, place, and time.  Normal reflexes, muscle tone coordination. No cranial nerve deficit noted. PSYCHIATRIC: Normal mood and affect. Normal behavior. Normal judgment and thought content. CARDIOVASCULAR: Normal heart rate noted, regular rhythm RESPIRATORY: Clear to auscultation bilaterally. Effort and breath sounds normal, no problems with respiration noted. BREASTS: Symmetric in size. No masses, skin changes, nipple drainage, or lymphadenopathy. ABDOMEN: Soft, normal bowel sounds, no distention noted.  No tenderness, rebound or guarding.  PELVIC: Normal appearing external genitalia; normal appearing vaginal mucosa and cervix.  No abnormal discharge noted.  Pap smear obtained.  Normal uterine size, no other palpable masses, no uterine or adnexal tenderness. MUSCULOSKELETAL: Normal range of motion. No tenderness.  No cyanosis, clubbing, or edema.  2+ distal pulses.   Assessment:  Annual gynecologic examination with pap smear STD exposure Menorrhagia    Plan:  Will follow up results of pap smear and STD testing, manage accordingly. Reviewed IUD for menorrhagia, she would like to try. Will schedule appt for insertion. Routine preventative health maintenance measures emphasized. Please refer to After Visit Summary for other counseling recommendations.    Hermina StaggersMichael L Hadriel Northup, MD, FACOG Attending Obstetrician & Gynecologist Center for [facility], [facility]

## 2019-07-16 ENCOUNTER — Telehealth: Payer: Self-pay | Admitting: Family Medicine

## 2019-07-16 LAB — CYTOLOGY - PAP
Adequacy: ABSENT
Diagnosis: NEGATIVE
HPV: NOT DETECTED

## 2019-07-16 NOTE — Telephone Encounter (Signed)
Spoke to patient about her appointment on 9/8 @ 10:30. Patient instructed to wear a face mask for the entire appointment and no visitors are allowed with her during the visit. Patient screened for covid symptoms and denied having any

## 2019-07-17 ENCOUNTER — Emergency Department (HOSPITAL_COMMUNITY)
Admission: EM | Admit: 2019-07-17 | Discharge: 2019-07-17 | Disposition: A | Discharge status: Home / Self Care | Payer: Medicaid Other | Attending: Emergency Medicine | Admitting: Emergency Medicine

## 2019-07-17 ENCOUNTER — Other Ambulatory Visit: Payer: Self-pay

## 2019-07-17 DIAGNOSIS — Z79899 Other long term (current) drug therapy: Secondary | ICD-10-CM | POA: Insufficient documentation

## 2019-07-17 DIAGNOSIS — I10 Essential (primary) hypertension: Secondary | ICD-10-CM | POA: Diagnosis not present

## 2019-07-17 DIAGNOSIS — F17210 Nicotine dependence, cigarettes, uncomplicated: Secondary | ICD-10-CM | POA: Diagnosis not present

## 2019-07-17 LAB — CERVICOVAGINAL ANCILLARY ONLY
Bacterial vaginitis: POSITIVE — AB
Candida vaginitis: NEGATIVE
Chlamydia: NEGATIVE
Neisseria Gonorrhea: NEGATIVE
Trichomonas: POSITIVE — AB

## 2019-07-17 MED ORDER — AMLODIPINE BESYLATE 5 MG PO TABS
5.0000 mg | ORAL_TABLET | Freq: Once | ORAL | Status: AC
  Administered 2019-07-17: 5 mg via ORAL
  Filled 2019-07-17: qty 1

## 2019-07-17 NOTE — ED Notes (Signed)
Pt given dc instructions pt verbalizes understanding.  

## 2019-07-17 NOTE — ED Provider Notes (Signed)
[facility] EMERGENCY DEPARTMENT Provider Note   CSN: [id number] Arrival date & time: 07/17/19  1053     History   Chief Complaint Chief Complaint  Patient presents with  . Hypertension    HPI Katie Ross is a 33 y.o. female with PMHx of bilateral keratoconus and bipolar disorder presenting to ED with concerns of hypertension. She reports that she has been having elevated BP readings at home ranging 097-353 systolic and >299 diastolic. She reports highest reading last night of 195/111, prompting her to present to ED this morning. She also reports heightened stress and anxiety with decreased sleep and notes that at times she can feel her heart "flutter". She denies any headaches, dizziness, acute vision changes, chest pain, shortness of breath, or focal weakness.     Past Medical History:  Diagnosis Date  . Anemia     Patient Active Problem List   Diagnosis Date Noted  . STD exposure 07/15/2019  . Visit for routine gyn exam 03/30/2018  . Menorrhagia 02/21/2016    Past Surgical History:  Procedure Laterality Date  . CESAREAN SECTION     x3  . HERNIA REPAIR       OB History    Gravida  3   Para  3   Term  3   Preterm  0   AB  0   Living  3     SAB  0   TAB  0   Ectopic  0   Multiple  0   Live Births  3           Home Medications    Prior to Admission medications   Medication Sig Start Date End Date Taking? Authorizing Provider  cephALEXin (KEFLEX) 500 MG capsule Take 1 capsule (500 mg total) by mouth 3 (three) times daily. 06/08/18   Hedges, Dellis Filbert, PA-C  citalopram (CELEXA) 10 MG tablet Take 1 tablet by mouth at bedtime. 02/20/18   [provider]  lamoTRIgine (LAMICTAL) 25 MG tablet Take 1 tablet by mouth 2 (two) times daily. 10/25/16   [provider]  LATUDA 40 MG TABS tablet Take 1 tablet by mouth at bedtime. 02/20/18   [provider]  megestrol (MEGACE) 40 MG tablet Take 1 tablet (40 mg  total) by mouth daily. 03/30/18   Chancy Milroy, MD    Family History No family history on file.  Social History Social History   Tobacco Use  . Smoking status: Current Every Day Smoker    Packs/day: 3.00    Types: Cigars  . Smokeless tobacco: Never Used  Substance Use Topics  . Alcohol use: Yes    Comment: occ  . Drug use: No     Allergies   Patient has no known allergies.   Review of Systems Review of Systems  Constitutional: Negative for chills and fever.  HENT: Negative for congestion, sinus pain and sore throat.   Eyes: Positive for blurred vision and double vision.       Unchanged from baseline   Respiratory: Negative for cough, shortness of breath and wheezing.   Cardiovascular: Positive for palpitations. Negative for chest pain.       Feels like "flutter" in chest   Gastrointestinal: Negative for abdominal pain, constipation, diarrhea, nausea and vomiting.  Endocrine: Negative.   Genitourinary: Negative.   Musculoskeletal: Negative for back pain and joint pain.  Neurological: Positive for sensory change. Negative for dizziness, focal weakness and headaches.  Generalized numbness  All other systems reviewed and are negative.    Physical Exam Updated Vital Signs BP (!) 140/96 (BP Location: Right Arm)   Pulse 67   Temp 98.5 F (36.9 C) (Oral)   Resp 18   SpO2 100%   Physical Exam Constitutional:      General: She is not in acute distress.    Appearance: Normal appearance. She is obese. She is not diaphoretic.  HENT:     Head: Normocephalic and atraumatic.     Mouth/Throat:     Mouth: Mucous membranes are moist.     Pharynx: Oropharynx is clear.  Eyes:     Extraocular Movements: Extraocular movements intact.     Conjunctiva/sclera: Conjunctivae normal.     Pupils: Pupils are equal, round, and reactive to light.  Neck:     Vascular: No carotid bruit.  Cardiovascular:     Rate and Rhythm: Normal rate and regular rhythm.     Pulses: Normal  pulses.     Heart sounds: Normal heart sounds. No murmur. No friction rub. No gallop.   Pulmonary:     Effort: Pulmonary effort is normal. No respiratory distress.     Breath sounds: Normal breath sounds. No stridor. No wheezing, rhonchi or rales.  Abdominal:     General: Bowel sounds are normal. There is distension.     Palpations: Abdomen is soft.     Tenderness: There is no abdominal tenderness.  Skin:    General: Skin is warm and dry.     Capillary Refill: Capillary refill takes less than 2 seconds.  Neurological:     General: No focal deficit present.     Mental Status: She is alert and oriented to person, place, and time. Mental status is at baseline.     Cranial Nerves: No cranial nerve deficit.     Sensory: No sensory deficit.     Motor: No weakness.     ED Treatments / Results  Labs (all labs ordered are listed, but only abnormal results are displayed) Labs Reviewed - No data to display  EKG None  Radiology No results found.  Procedures Procedures (including critical care time)  Medications Ordered in ED Medications - No data to display   Initial Impression / Assessment and Plan / ED Course  I have reviewed the triage vital signs and the nursing notes.  Pertinent labs & imaging results that were available during my care of the patient were reviewed by me and considered in my medical decision making (see chart for details).  Patient is a 33yo female with PMHx of bilateral keratocona and bipolar disorder (not currently on medication) presenting with concerns of elevated BP readings at home for several weeks with the highest reading of 195/111 last night. Patient also reports that she is under more stress than usual and has severe anxiety. She has not been sleeping well over the past few weeks. She reports feelings of numbness in her extremities and intermittent feelings of heart flutter when she is anxious.  Otherwise, patient does not have any headaches,  dizziness, chest pain, shortness of breath, or focal weakness.   In the ED, BP 140s/90s and patient does not have any neurologic deficits. EKG is NSR without ST changes. Patient given amlodipine 5mg  in ED.  Patient has PCP appointment on Tuesday. Instructed patient to follow up with PCP and take BP monitor to appointment for calibration.  Also instructed to seek emergent medical attention if develop severe chest pain, shortness  of breath or severe headache, dizziness or acute changes in vision.   Final Clinical Impressions(s) / ED Diagnoses   Final diagnoses:  None    ED Discharge Orders    None       Eliezer Bottom, MD 07/17/19 1250    Gerhard Munch, MD 07/19/19 [phone]

## 2019-07-17 NOTE — ED Notes (Signed)
Got patient into a gown on the monitor patient is resting with call bell in reach got patient a warm blanket  °

## 2019-07-17 NOTE — ED Triage Notes (Signed)
Pt to ER for evaluation of high blood pressure readings of 190's at home, has been checking daily. Reports has PCP appointment. Today is 140/96

## 2019-07-17 NOTE — Discharge Instructions (Addendum)
Katie Ross,  You presented to ED today with concerns of high blood pressure. Your EKG and vitals were within normal limits and we observed you without any changes in your examination.   Seek emergent medical attention if you develop severe chest pain, shortness of breath, severe headache, dizziness, nausea/vomiting or acute changes in vision.

## 2019-07-20 ENCOUNTER — Other Ambulatory Visit: Payer: Medicaid Other

## 2019-07-20 ENCOUNTER — Other Ambulatory Visit: Payer: Self-pay

## 2019-07-21 LAB — RPR: RPR Ser Ql: NONREACTIVE

## 2019-07-21 LAB — HEPATITIS B SURFACE ANTIGEN: Hepatitis B Surface Ag: NEGATIVE

## 2019-07-21 LAB — HEPATITIS C ANTIBODY: Hep C Virus Ab: <0.1 s/co ratio (ref 0.0–0.9)

## 2019-07-21 LAB — HIV ANTIBODY (ROUTINE TESTING W REFLEX): HIV Screen 4th Generation wRfx: NONREACTIVE

## 2019-07-22 ENCOUNTER — Telehealth: Payer: Self-pay

## 2019-07-22 ENCOUNTER — Other Ambulatory Visit: Payer: Self-pay | Admitting: Obstetrics & Gynecology

## 2019-07-22 MED ORDER — METRONIDAZOLE 500 MG PO TABS: 500.0000 mg | ORAL_TABLET | Freq: Two times a day (BID) | ORAL | 0 refills | Status: DC

## 2019-07-22 NOTE — Progress Notes (Unsigned)
Flagyl prescribed for + trich and bv. RN will notify patient.

## 2019-07-22 NOTE — Telephone Encounter (Addendum)
-----   Message from Emily Filbert, MD sent at 07/22/2019  9:36 AM EDT -----   + bv and trich I prescribed flagyl  Called pt and informed pt results and to make sure that she informs partner(s) to get treated as well.  I informed pt that the Flagyl medication that was prescribed has been sent [facility] on [address].  I also informed pt that someone from the front office will call to schedule a TOC in 4 weeks.  Pt verbalized understanding.

## 2019-07-26 ENCOUNTER — Other Ambulatory Visit: Payer: Self-pay

## 2019-07-26 DIAGNOSIS — N921 Excessive and frequent menstruation with irregular cycle: Secondary | ICD-10-CM

## 2019-07-26 MED ORDER — MEGESTROL ACETATE 40 MG PO TABS: 40.0000 mg | ORAL_TABLET | Freq: Every day | ORAL | 3 refills | Status: DC

## 2019-07-26 NOTE — Progress Notes (Signed)
Per Dr.Ervin okay to send 40mg  megace X3 refills for patients request.

## 2019-07-28 ENCOUNTER — Telehealth: Payer: Self-pay | Admitting: General Practice

## 2019-07-28 DIAGNOSIS — B379 Candidiasis, unspecified: Secondary | ICD-10-CM

## 2019-07-28 MED ORDER — FLUCONAZOLE 150 MG PO TABS: 150.0000 mg | ORAL_TABLET | Freq: Once | ORAL | 0 refills | Status: AC

## 2019-07-28 NOTE — Telephone Encounter (Signed)
Patient called and left message on nurse voicemail line stating she is on antibiotics for a bacterial infection & treatment of a STD and it has caused a yeast infection. Patient is requesting medication. Diflucan sent in per protocol. Called & informed patient. She verbalized understanding & had no questions.

## 2019-08-19 ENCOUNTER — Other Ambulatory Visit (HOSPITAL_COMMUNITY)
Admission: RE | Admit: 2019-08-19 | Discharge: 2019-08-19 | Disposition: A | Discharge status: Home / Self Care | Payer: Medicaid Other | Source: Ambulatory Visit | Attending: Family Medicine | Admitting: Family Medicine

## 2019-08-19 ENCOUNTER — Other Ambulatory Visit: Payer: Self-pay

## 2019-08-19 ENCOUNTER — Ambulatory Visit (INDEPENDENT_AMBULATORY_CARE_PROVIDER_SITE_OTHER): Payer: Medicaid Other

## 2019-08-19 DIAGNOSIS — T3695XA Adverse effect of unspecified systemic antibiotic, initial encounter: Secondary | ICD-10-CM | POA: Diagnosis present

## 2019-08-19 DIAGNOSIS — B379 Candidiasis, unspecified: Secondary | ICD-10-CM | POA: Insufficient documentation

## 2019-08-19 NOTE — Progress Notes (Signed)
Pt presents to office today for test of cure self-swab. Pt reports testing positive for trichomonas and BV on 07/15/19; completed abx and developed yeast infection. Medication prescribed on 07/28/19 for yeast infection, which cleared up infection entirely. Now pt reports she is taking antibiotics for an eye infection and has developed another yeast infection. Educated that we will not start treatment for yeast infection until entire course of antibiotics is completed. Pt agreeable. Instructions for self-swab given and specimen obtained. Explained we will call only if results are abnormal; pt encouraged to call if results are negative and she is still experiencing symptoms at completion of antibiotics. Pt verbalizes understanding.   Apolonio Schneiders RN 08/19/19

## 2019-08-23 NOTE — Progress Notes (Signed)
Patient seen and assessed by nursing staff during this encounter. I have reviewed the chart and agree with the documentation and plan.  Deasiah Hagberg, MD 08/23/2019 2:39 PM    

## 2019-08-30 ENCOUNTER — Ambulatory Visit: Payer: Medicaid Other | Admitting: Obstetrics and Gynecology

## 2019-08-30 LAB — CERVICOVAGINAL ANCILLARY ONLY
Bacterial Vaginitis (gardnerella): NEGATIVE
Candida Glabrata: NEGATIVE
Candida Vaginitis: POSITIVE — AB
Chlamydia: NEGATIVE
Comment: NEGATIVE
Comment: NEGATIVE
Comment: NEGATIVE
Comment: NEGATIVE
Comment: NEGATIVE
Comment: NORMAL
Neisseria Gonorrhea: NEGATIVE
Trichomonas: NEGATIVE

## 2019-09-01 ENCOUNTER — Other Ambulatory Visit: Payer: Self-pay | Admitting: Obstetrics and Gynecology

## 2019-09-01 MED ORDER — FLUCONAZOLE 150 MG PO TABS: 150.0000 mg | ORAL_TABLET | Freq: Once | ORAL | 0 refills | Status: DC | PRN

## 2019-09-27 ENCOUNTER — Encounter: Payer: Self-pay | Admitting: Obstetrics and Gynecology

## 2019-09-27 ENCOUNTER — Other Ambulatory Visit: Payer: Self-pay

## 2019-09-27 ENCOUNTER — Ambulatory Visit (INDEPENDENT_AMBULATORY_CARE_PROVIDER_SITE_OTHER): Payer: Medicaid Other | Admitting: Obstetrics and Gynecology

## 2019-09-27 VITALS — BP 132/88 | HR 66 | Wt 284.9 lb

## 2019-09-27 DIAGNOSIS — N920 Excessive and frequent menstruation with regular cycle: Secondary | ICD-10-CM

## 2019-09-27 DIAGNOSIS — Z30430 Encounter for insertion of intrauterine contraceptive device: Secondary | ICD-10-CM

## 2019-09-27 HISTORY — DX: Encounter for insertion of intrauterine contraceptive device: Z30.430

## 2019-09-27 LAB — POCT PREGNANCY, URINE: Preg Test, Ur: NEGATIVE

## 2019-09-27 MED ORDER — LEVONORGESTREL 19.5 MCG/DAY IU IUD
INTRAUTERINE_SYSTEM | Freq: Once | INTRAUTERINE | Status: AC
  Administered 2019-09-27: 1 via INTRAUTERINE

## 2019-09-27 NOTE — Patient Instructions (Signed)

## 2019-09-27 NOTE — Progress Notes (Signed)
    GYNECOLOGY CLINIC PROCEDURE NOTE  Katie Ross is a 33 y.o. [id number] here for [place] IUD insertion. No GYN concerns.     IUD Insertion Procedure Note Patient identified, informed consent performed, consent signed.   Discussed risks of irregular bleeding, cramping, infection, malpositioning or misplacement of the IUD outside the uterus which may require further procedure such as laparoscopy. Time out was performed.  Urine pregnancy test negative.  Speculum placed in the vagina.  Cervix visualized.  Cleaned with Betadine x 2.  Grasped anteriorly with a single tooth tenaculum.  Uterus sounded to 8 cm.  Cerritos IUD placed per manufacturer's recommendations.  Strings trimmed to 3 cm. Tenaculum was removed, good hemostasis noted.  Patient tolerated procedure well.   Patient was given post-procedure instructions.  She was advised to have backup contraception for one week.  Patient was also asked to check IUD strings periodically and follow up in 4 weeks for IUD check.     Arlina Robes, MD, [place] Attending [place] for [place]

## 2019-10-11 ENCOUNTER — Encounter: Payer: Self-pay | Admitting: *Deleted

## 2019-10-13 ENCOUNTER — Other Ambulatory Visit: Payer: Self-pay

## 2019-10-13 ENCOUNTER — Ambulatory Visit (INDEPENDENT_AMBULATORY_CARE_PROVIDER_SITE_OTHER): Payer: Medicaid Other

## 2019-10-13 ENCOUNTER — Other Ambulatory Visit (HOSPITAL_COMMUNITY)
Admission: RE | Admit: 2019-10-13 | Discharge: 2019-10-13 | Disposition: A | Discharge status: Home / Self Care | Payer: Medicaid Other | Source: Ambulatory Visit | Attending: Obstetrics and Gynecology | Admitting: Obstetrics and Gynecology

## 2019-10-13 DIAGNOSIS — N898 Other specified noninflammatory disorders of vagina: Secondary | ICD-10-CM | POA: Diagnosis present

## 2019-10-13 NOTE — Progress Notes (Signed)
Pt here today for self swab for retesting for Trich.  Pt reports that she had sex again with the infected partner while she started out using a condom afterwards she reports that the partner took off the condom.  Pt explained how to obtain self swab and that we will call with abnormal results.  Pt verbalized understanding.    Mel Almond, RN 10/13/19

## 2019-10-13 NOTE — Progress Notes (Signed)
Patient seen and assessed by nursing staff during this encounter. I have reviewed the chart and agree with the documentation and plan.  Marcille Buffy DNP, CNM  10/13/19  11:14 AM

## 2019-10-15 LAB — CERVICOVAGINAL ANCILLARY ONLY
Bacterial Vaginitis (gardnerella): POSITIVE — AB
Candida Glabrata: NEGATIVE
Candida Vaginitis: NEGATIVE
Chlamydia: NEGATIVE
Comment: NEGATIVE
Comment: NEGATIVE
Comment: NEGATIVE
Comment: NEGATIVE
Comment: NEGATIVE
Comment: NORMAL
Neisseria Gonorrhea: NEGATIVE
Trichomonas: POSITIVE — AB

## 2019-10-25 ENCOUNTER — Telehealth: Payer: Self-pay

## 2019-10-25 MED ORDER — METRONIDAZOLE 500 MG PO TABS: 2000.0000 mg | ORAL_TABLET | Freq: Once | ORAL | 0 refills | Status: AC

## 2019-10-25 NOTE — Telephone Encounter (Signed)
Pt called and stated that she received her results via [place] [facility] from 10/13/19 and we have not called her about her results.  Per chart review, pt tested + for Trich.  I called pt and apologized for her having to see her results without [place] calling.  I advised pt that she needs to tell her partner and they need get treated as well.   Flagy e-prescribed per protocol.

## 2019-10-29 ENCOUNTER — Other Ambulatory Visit: Payer: Self-pay

## 2019-10-29 ENCOUNTER — Other Ambulatory Visit (HOSPITAL_COMMUNITY)
Admission: RE | Admit: 2019-10-29 | Discharge: 2019-10-29 | Disposition: A | Discharge status: Home / Self Care | Payer: Medicaid Other | Source: Ambulatory Visit | Attending: Obstetrics and Gynecology | Admitting: Obstetrics and Gynecology

## 2019-10-29 ENCOUNTER — Ambulatory Visit (INDEPENDENT_AMBULATORY_CARE_PROVIDER_SITE_OTHER): Payer: Medicaid Other | Admitting: Obstetrics and Gynecology

## 2019-10-29 ENCOUNTER — Encounter: Payer: Self-pay | Admitting: Obstetrics and Gynecology

## 2019-10-29 DIAGNOSIS — A599 Trichomoniasis, unspecified: Secondary | ICD-10-CM | POA: Insufficient documentation

## 2019-10-29 DIAGNOSIS — Z30431 Encounter for routine checking of intrauterine contraceptive device: Secondary | ICD-10-CM

## 2019-10-29 NOTE — Addendum Note (Signed)
Addended by: Chancy Milroy on: 10/29/2019 09:56 AM   Modules accepted: Orders

## 2019-10-29 NOTE — Progress Notes (Signed)
Katie Ross presents for IUD check. Lilettia placed on 09/27/19. Having some irregular bleeding o/w doing well. Has been sexual active since insertion without problems  Dx with trich 10/13/19. Has completed treatment  PE AF VSS Lungs clear Heart RRR Abd soft + BS GU Nl EGBUS, IUD string noted  A/P IUD check up         Trich TOC today. Pt reassured in regards to bleeding pattern F/U PRN

## 2019-11-01 LAB — CERVICOVAGINAL ANCILLARY ONLY
Comment: NEGATIVE
Trichomonas: POSITIVE — AB

## 2019-11-02 MED ORDER — METRONIDAZOLE 500 MG PO TABS: 500.0000 mg | ORAL_TABLET | Freq: Two times a day (BID) | ORAL | 0 refills | Status: DC

## 2019-11-02 NOTE — Addendum Note (Signed)
Addended by: Donn Pierini on: 11/02/2019 08:53 AM   Modules accepted: Orders

## 2019-11-23 ENCOUNTER — Ambulatory Visit: Payer: Medicaid Other

## 2019-11-25 ENCOUNTER — Ambulatory Visit: Payer: Medicaid Other

## 2019-11-26 ENCOUNTER — Other Ambulatory Visit: Payer: Medicaid Other

## 2019-12-02 ENCOUNTER — Ambulatory Visit: Payer: Medicaid Other

## 2019-12-13 ENCOUNTER — Ambulatory Visit: Payer: Medicaid Other | Attending: Internal Medicine

## 2019-12-13 DIAGNOSIS — Z20822 Contact with and (suspected) exposure to covid-19: Secondary | ICD-10-CM

## 2019-12-14 LAB — NOVEL CORONAVIRUS, NAA: SARS-CoV-2, NAA: NOT DETECTED

## 2019-12-16 ENCOUNTER — Other Ambulatory Visit (HOSPITAL_COMMUNITY)
Admission: RE | Admit: 2019-12-16 | Discharge: 2019-12-16 | Disposition: A | Discharge status: Home / Self Care | Payer: Medicaid Other | Source: Ambulatory Visit | Attending: Family Medicine | Admitting: Family Medicine

## 2019-12-16 ENCOUNTER — Ambulatory Visit (INDEPENDENT_AMBULATORY_CARE_PROVIDER_SITE_OTHER): Payer: Medicaid Other

## 2019-12-16 ENCOUNTER — Other Ambulatory Visit: Payer: Self-pay

## 2019-12-16 DIAGNOSIS — N898 Other specified noninflammatory disorders of vagina: Secondary | ICD-10-CM | POA: Insufficient documentation

## 2019-12-16 NOTE — Progress Notes (Signed)
Pt here today for TOC.  Pt explained on how to obtain self swab.  Pt advised that we will call with abnormal results.  Pt verbalized understanding with no further questions.   Addison Naegeli, RN 12/16/19

## 2019-12-16 NOTE — Progress Notes (Signed)
I have reviewed this chart and agree with the RN/CMA assessment and management.    K. Meryl Raphael Espe, M.D. Attending [facility])   

## 2019-12-20 ENCOUNTER — Other Ambulatory Visit: Payer: Self-pay

## 2019-12-20 LAB — CERVICOVAGINAL ANCILLARY ONLY
Bacterial Vaginitis (gardnerella): POSITIVE — AB
Candida Glabrata: NEGATIVE
Candida Vaginitis: NEGATIVE
Chlamydia: NEGATIVE
Comment: NEGATIVE
Comment: NEGATIVE
Comment: NEGATIVE
Comment: NEGATIVE
Comment: NEGATIVE
Comment: NORMAL
Neisseria Gonorrhea: NEGATIVE
Trichomonas: POSITIVE — AB

## 2019-12-21 ENCOUNTER — Telehealth (INDEPENDENT_AMBULATORY_CARE_PROVIDER_SITE_OTHER): Payer: Medicaid Other

## 2019-12-21 ENCOUNTER — Other Ambulatory Visit: Payer: Self-pay | Admitting: Obstetrics and Gynecology

## 2019-12-21 DIAGNOSIS — A599 Trichomoniasis, unspecified: Secondary | ICD-10-CM

## 2019-12-21 MED ORDER — METRONIDAZOLE 500 MG PO TABS: 500.0000 mg | ORAL_TABLET | Freq: Two times a day (BID) | ORAL | 0 refills | Status: DC

## 2019-12-21 MED ORDER — METRONIDAZOLE 500 MG PO TABS: ORAL_TABLET | ORAL | 0 refills | Status: DC

## 2019-12-21 NOTE — Telephone Encounter (Addendum)
/-----   Message from Conan Bowens, MD sent at 12/21/2019  3:02 PM EST ----- Please call and let patient know she remains positive for trich, needs treatment, which I have sent to pharmacy. Partner needs ot be treated.  Called pt and pt stated that she saw the results in MyChart.  Pt stated that she does not know why she still has it because she has taken the tx and she has not had sex since nor is she with the partner.  Verified with Dr. Earlene Plater if pt can have another form of tx for tx.  Dr. Earlene Plater agreed to Flagyl 500 mg po bid for 7 days and TOC in three weeks.  Informed pt providers recommendation.  TOC appt scheduled for 01/13/20 @ 1330.  Pt verbalized understanding.   Addison Naegeli, RN 12/21/19

## 2020-01-12 ENCOUNTER — Telehealth: Payer: Self-pay | Admitting: Obstetrics & Gynecology

## 2020-01-12 NOTE — Telephone Encounter (Signed)
The patient called in stating she could not make the appointment because she has to work. She stated she can only come in after 3pm. She would like to come in on Friday. Informed the patient of the office hours on Friday. Offered Monday. The patient accepted and verbalized understanding.

## 2020-01-13 ENCOUNTER — Ambulatory Visit: Payer: Medicaid Other

## 2020-01-24 ENCOUNTER — Ambulatory Visit (INDEPENDENT_AMBULATORY_CARE_PROVIDER_SITE_OTHER): Payer: Medicaid Other | Admitting: General Practice

## 2020-01-24 ENCOUNTER — Other Ambulatory Visit: Payer: Self-pay

## 2020-01-24 ENCOUNTER — Other Ambulatory Visit (HOSPITAL_COMMUNITY)
Admission: RE | Admit: 2020-01-24 | Discharge: 2020-01-24 | Disposition: A | Discharge status: Home / Self Care | Payer: Medicaid Other | Source: Ambulatory Visit | Attending: Family Medicine | Admitting: Family Medicine

## 2020-01-24 DIAGNOSIS — Z8619 Personal history of other infectious and parasitic diseases: Secondary | ICD-10-CM | POA: Insufficient documentation

## 2020-01-24 NOTE — Progress Notes (Signed)
Patient seen and assessed by nursing staff during this encounter. I have reviewed the chart and agree with the documentation and plan.  Vonzella Nipple, PA-C 01/24/2020 4:05 PM

## 2020-01-24 NOTE — Progress Notes (Signed)
Patient presents to office today for test of cure following recent + trichomonas on 2/4- patient was also positive for BV at that time. Patient states she was told we were going to send in medicine to take twice a day for a week but when she went to the pharmacy it was just 4 pills. Patient states she did take the medicine and hasn't had intercourse since. She states she thinks she is still positive for something as she still has an odor and also a yeast infection. Patient instructed in self swab & specimen collected. Discussed results will be back in 24-48 hours via mychart. If still positive for BV, she will need flagyl 500mg  BID x 1 week.  RN BSN 01/24/20

## 2020-01-25 ENCOUNTER — Other Ambulatory Visit: Payer: Self-pay | Admitting: Medical

## 2020-01-25 DIAGNOSIS — B9689 Other specified bacterial agents as the cause of diseases classified elsewhere: Secondary | ICD-10-CM

## 2020-01-25 DIAGNOSIS — N760 Acute vaginitis: Secondary | ICD-10-CM

## 2020-01-25 LAB — CERVICOVAGINAL ANCILLARY ONLY
Bacterial Vaginitis (gardnerella): POSITIVE — AB
Candida Glabrata: NEGATIVE
Candida Vaginitis: NEGATIVE
Comment: NEGATIVE
Comment: NEGATIVE
Comment: NEGATIVE
Comment: NEGATIVE
Trichomonas: NEGATIVE

## 2020-01-25 MED ORDER — METRONIDAZOLE 500 MG PO TABS: 500.0000 mg | ORAL_TABLET | Freq: Two times a day (BID) | ORAL | 0 refills | Status: DC

## 2020-07-18 ENCOUNTER — Other Ambulatory Visit: Payer: Self-pay

## 2020-07-18 ENCOUNTER — Other Ambulatory Visit: Payer: Medicaid Other

## 2020-07-18 DIAGNOSIS — Z20822 Contact with and (suspected) exposure to covid-19: Secondary | ICD-10-CM

## 2020-07-19 LAB — NOVEL CORONAVIRUS, NAA: SARS-CoV-2, NAA: NOT DETECTED

## 2020-07-26 ENCOUNTER — Other Ambulatory Visit (HOSPITAL_COMMUNITY)
Admission: RE | Admit: 2020-07-26 | Discharge: 2020-07-26 | Disposition: A | Discharge status: Home / Self Care | Payer: Medicaid Other | Source: Ambulatory Visit | Attending: Obstetrics and Gynecology | Admitting: Obstetrics and Gynecology

## 2020-07-26 ENCOUNTER — Ambulatory Visit (INDEPENDENT_AMBULATORY_CARE_PROVIDER_SITE_OTHER): Payer: Medicaid Other

## 2020-07-26 ENCOUNTER — Other Ambulatory Visit: Payer: Self-pay

## 2020-07-26 VITALS — BP 147/94 | HR 68 | Wt 272.1 lb

## 2020-07-26 DIAGNOSIS — R350 Frequency of micturition: Secondary | ICD-10-CM

## 2020-07-26 DIAGNOSIS — N898 Other specified noninflammatory disorders of vagina: Secondary | ICD-10-CM | POA: Insufficient documentation

## 2020-07-26 DIAGNOSIS — R319 Hematuria, unspecified: Secondary | ICD-10-CM | POA: Diagnosis not present

## 2020-07-26 DIAGNOSIS — R309 Painful micturition, unspecified: Secondary | ICD-10-CM

## 2020-07-26 LAB — POCT URINALYSIS DIP (DEVICE)
Bilirubin Urine: NEGATIVE
Glucose, UA: NEGATIVE mg/dL
Ketones, ur: NEGATIVE mg/dL
Leukocytes,Ua: NEGATIVE
Nitrite: NEGATIVE
Protein, ur: NEGATIVE mg/dL
Specific Gravity, Urine: >=1.03 (ref 1.005–1.030)
Urobilinogen, UA: 1 mg/dL (ref 0.0–1.0)
pH: 6.5 (ref 5.0–8.0)

## 2020-07-26 NOTE — Patient Instructions (Signed)
[facility] [employer]  Department of Social Services[place] [address], [place], [place] [address] [phone]   or  www.[place].gov[place]/human-services/social-services **SNAP/EBT/ Other nutritional benefits  [place] DHHS-Public Health-WIC [address], [place], [place] [address] [phone]  or  https://[place].gov[place]/human-services/health-department **WIC for  women who are pregnant and postpartum, infants and children up to 5 years old  [facility] [address], [place], [place] [address] [phone]   or   www.theblessedtable.org  **Food pantry  [facility] [address], [place], [place] [address] [phone]   or   https://brotherkolbes.godaddysites.com  **Emergency food and prepared meals  [facility] [facility] [address], [place], [place] [address] [phone]   or   www.cedargrovetop.us **Food pantry  [facility] [employer] [facility] [address], [place], [place] [address] [phone]   or   www.facebook.com/[facility]-116430931718202 **Food pantry  [facility] [address], [place], [place] [address] [phone] **Food pantry  [facility] [address], [place], [place] [address] [phone]   or   www.greensborourbanministry.org  **Food pantry and prepared meals  [facility] [address], [place], [place] [address] https://jfsgreensboro.org/  **Food pantry  [facility] [employer] [facility] [address], [place], [place] [address] [phone]   or   www.lbcnow.org  **Food pantry  [employer] [facility] [address], [place], [place] [address] [phone]   or   http://www.onestepfurther.com **Food pantry, nutrition education, gardening activities  [employer] [address], [place], [place] [address] [phone] **Food pantry  [facility] [place] [address], [place], [place] [address] [phone]   or   www.salvationarmyof[place].org **Food pantry  [facility] [place] [address], [place], [place] [address] [phone]   or   http://senior-resources-[place].org **Meals on Wheels Program  [facility] [address], , [place] [address] [phone]   or   www.stmattchurch.com  **Food pantry  [facility] [facility] [address], , [place] [address] [phone]   or   [facility].org **Food pantry     

## 2020-07-26 NOTE — Progress Notes (Signed)
Pt here today for STD screening. Pt reports brown vaginal discharge with a fishy odor. Endorses vaginal irritation. Self-swab instructions given and specimen obtained. Also reports pain with urination and frequency. UA completed. Urine culture ordered due to patients symptoms and hematuria. Explained we will call with any abnormal results.  Pt's BP elevated today. Encouraged pt to check BP at home sometime in the next week and contact PCP if it is elevated.  Fleet Contras RN 07/26/20

## 2020-07-27 ENCOUNTER — Telehealth (INDEPENDENT_AMBULATORY_CARE_PROVIDER_SITE_OTHER): Payer: Medicaid Other

## 2020-07-27 DIAGNOSIS — B9689 Other specified bacterial agents as the cause of diseases classified elsewhere: Secondary | ICD-10-CM

## 2020-07-27 DIAGNOSIS — Z1331 Encounter for screening for depression: Secondary | ICD-10-CM

## 2020-07-27 DIAGNOSIS — A599 Trichomoniasis, unspecified: Secondary | ICD-10-CM

## 2020-07-27 LAB — CERVICOVAGINAL ANCILLARY ONLY
Bacterial Vaginitis (gardnerella): POSITIVE — AB
Candida Glabrata: NEGATIVE
Candida Vaginitis: NEGATIVE
Chlamydia: NEGATIVE
Comment: NEGATIVE
Comment: NEGATIVE
Comment: NEGATIVE
Comment: NEGATIVE
Comment: NEGATIVE
Comment: NORMAL
Neisseria Gonorrhea: NEGATIVE
Trichomonas: POSITIVE — AB

## 2020-07-27 MED ORDER — METRONIDAZOLE 500 MG PO TABS: 500.0000 mg | ORAL_TABLET | Freq: Two times a day (BID) | ORAL | 0 refills | Status: DC

## 2020-07-27 NOTE — Telephone Encounter (Signed)
Called pt; positive trichomonas and BV result given. Notified pt I have sent Flagyl 500 mg BID for 7 days to her pharmacy to treat both infections. Explained that pt should take with a meal and avoid alcohol while taking this medicine. Instructed pt to notify all recent partners of positive test and to not have intercourse for 2 weeks following end of treatment.  Pt expressed interest in seeing [facility] during visit yesterday. Followed up with patient who would like an appt at this time. Front office notified to schedule appt and referral placed.

## 2020-07-27 NOTE — Progress Notes (Signed)
Patient was assessed and managed by nursing staff during this encounter. I have reviewed the chart and agree with the documentation and plan. I have also made any necessary editorial changes.  Ladarren Steiner, MD 07/27/2020 10:57 PM   

## 2020-07-28 LAB — URINE CULTURE

## 2020-08-10 NOTE — BH Specialist Note (Addendum)
Integrated Behavioral Health via Telemedicine Video [employer]) Visit  08/10/2020 [facility] [place] [id number]  Number of Integrated Behavioral Health visits: 1 Session Start time: 1:15  Session End time: 1:35 Total time: 20 minutes  Referring Provider: Malott Bing, MD Type of Service: Individual Patient/Family location: [place], [place] [facility] Provider location: [facility] for [employer] [facility]  All persons participating in visit: Patient Halynn [place] and [facility] Rayme Bui [place]     I connected with Evva Pulice by a video enabled telemedicine application [employer]) and verified that I am speaking with the correct person using two identifiers.   Discussed confidentiality: Yes   Confirmed demographics & insurance:  Yes   I discussed that engaging in this virtual visit, they consent to the provision of behavioral healthcare and the services will be billed under their insurance.   Patient and/or legal guardian expressed understanding and consented to virtual visit: Yes   PRESENTING CONCERNS: Patient and/or family reports the following symptoms/concerns: Pt states her primary concern is being unmedicated for bipolar disorder, PTSD, depression and anxiety for over two years, along with life stress as a single mother of three children(20,15,13; 6yo grandchild), who may also need BH support; pt has a therapist who has recommended she re-establish care with psychiatry for [facility] medication management. Pt denies SI or HI.  Duration of problem: Ongoing; Severity of problem: moderately severe  STRENGTHS (Protective Factors/Coping Skills): Open to treatment; supportive therapist; good self-awareness and resilience  ASSESSMENT: Patient currently experiencing Bipolar affective disorder and PTSD, as previously diagnosed.    GOALS ADDRESSED: Patient will: 1.  Reduce symptoms of: anxiety, depression, mood instability and stress  2.  Increase knowledge and/or  ability of: stress reduction  3.  Demonstrate ability to: Increase healthy adjustment to current life circumstances   Progress of Goals: Ongoing  INTERVENTIONS: Interventions utilized:  Solution-Focused Strategies and Psychoeducation and/or Health Education Standardized Assessments completed & reviewed: Not given today   OUTCOME: Patient Response: Pt agrees to treatment plan   PLAN: 1. Follow up with behavioral health clinician on : By phone in one week 2. Behavioral recommendations:  -Accept referral to psychiatry -Continue attending regular therapy sessions with ongoing therapist  3. Referral(s): Integrated Art gallery manager (In Clinic) and [facility] (LME/Outside Clinic)  I discussed the assessment and treatment plan with the patient and/or parent/guardian. They were provided an opportunity to ask questions and all were answered. They agreed with the plan and demonstrated an understanding of the instructions.   They were advised to call back or seek an in-person evaluation as appropriate.  I discussed that the purpose of this visit is to provide behavioral health care while limiting exposure to the novel coronavirus.  Discussed there is a possibility of technology failure and discussed alternative modes of communication if that failure occurs.  Valetta Close Fraser Busche  Depression screen [facility] 2/9 07/26/2020 05/05/2017 07/04/2016  Decreased Interest 1 1 2   Down, Depressed, Hopeless 1 1 3   PHQ - 2 Score 2 2 5   Altered sleeping 3 1 3   Tired, decreased energy 1 1 1   Change in appetite 2 1 2   Feeling bad or failure about yourself  0 1 2  Trouble concentrating 3 1 0  Moving slowly or fidgety/restless 0 0 0  Suicidal thoughts 0 0 0  PHQ-9 Score 11 7 13    GAD 7 : Generalized Anxiety Score 07/26/2020 05/05/2017 07/04/2016  Nervous, Anxious, on Edge 0 1 3  Control/stop worrying 3 1 3  Worry too much - different things 3 1 3   Trouble relaxing 3 1 3   Restless 0  0 2  Easily annoyed or irritable 3 1 3   Afraid - awful might happen 2 1 2   Total GAD 7 Score 14 6 19

## 2020-08-14 ENCOUNTER — Other Ambulatory Visit: Payer: Self-pay

## 2020-08-14 ENCOUNTER — Ambulatory Visit (INDEPENDENT_AMBULATORY_CARE_PROVIDER_SITE_OTHER): Payer: Medicaid Other | Admitting: Clinical

## 2020-08-14 DIAGNOSIS — F3160 Bipolar disorder, current episode mixed, unspecified: Secondary | ICD-10-CM | POA: Diagnosis not present

## 2020-08-14 DIAGNOSIS — F4310 Post-traumatic stress disorder, unspecified: Secondary | ICD-10-CM | POA: Diagnosis not present

## 2020-08-14 NOTE — Patient Instructions (Signed)
Center for [facility] at [facility] [address] [place], [place] [address] [phone] (main office) [phone] (Saray Capasso's office)  Behavioral Health Resources:   What if I or someone I know is in crisis?   If you are thinking about harming yourself or having thoughts of suicide, or if you know someone who is, seek help right away.   Call your doctor or mental health care provider.   Call 911 or go to a hospital emergency room to get immediate help, or ask a friend or family member to help you do these things; IF YOU ARE IN [place], YOU MAY GO TO WALK-IN URGENT CARE 24/7 at [facility] (see below)   Call the [place] National Suicide Prevention Lifelines toll-free, 24-hour hotline at 1-800-273-TALK 608-825-8290) or TTY: 1-800-799-4 TTY 5593801998) to talk to a trained counselor.   If you are in crisis, make sure you are not left alone.    If someone else is in crisis, make sure he or she is not left alone   24 Hour :   [facility]  [address] [place], [place] [address] [phone] or [phone] WALK-IN URGENT CARE 24/7  [facility] Mobile Crisis: [phone]  [place] National Suicide Hotline: 863-640-9536  [facility] of the [employer] (Domestic Violence, Rape & Victim Assistance)  (763)048-9617  [facility]  [address], [place]  [address]   [phone] or [phone]   [facility]: [phone] (8am-4pm) or [phone] (after hours)        [facility], [address] [place], [place]  [phone] Fax: [phone] [email or web address] *Interpreters available *Accepts all insurance and uninsured for Urgent Care needs *Accepts Medicaid and uninsured for outpatient treatment   [facility]   Mon-Fri:  8am-5pm [address], [place], [place] [phone](WIOXB); [phone](fax) [email or web address]  *Accepts Medicare  [facility] [place] [place], Fri: 8am-4pm [address], [place], [place] [address] [phone] (phone); [phone] (fax) ExShows.[email or web address]  *Accepts Medicare  [facility] Mon-Fri: 9am-5pm  [address] [place], [place] [phone] (phone); [phone]  [email or web address]  *Accepts Medicaid  [facility] [address] [place], [place]  [phone] [email or web address].[email or web address]   [facility] [place], 8:30am-12pm/1pm-2:30pm [address], [place], [place] [phone] (phone); [phone] (fax) www.[email or web address]  *Accepts Medicaid, sliding-scale*Bilingual services available  [facility] Mon-Fri, 8am-7pm [address] [place], [place]  [phone](TWSFK); [phone](fax) www.[email or web address]  *Accepts Medicaid *Bilingual services available  [facility] Mon-Fri: 8am-5pm, Saturday by appointment only [address] [place], [place], [place] [phone] (phone); [phone] (fax) [email or web address]   [facility] [address], [place], [place] [phone] [email or web address]  *Free & reduced services for uninsured and underinsured individuals *Bilingual services for Spanish-speaking clients 21 and under  [facility], [address], [place], [place] [phone](TMAUQ); [phone](fax) KittenExchange.[email or web address]  *Bring your own interpreter at first visit *Accepts Medicare and [facility] Mon-Fri: 9am-5:30pm [address], [place], [place] [phone] (phone), [phone] (fax) After hours crisis line:  ([phone] www.[email or web address]  *Accepts Medicare and Medicaid  [facility], 8am-6pm [address] [place], [place]  [phone] (phone); [phone] (fax) http://[email or web address]  *Subsidized costs available  [facility] Mon-Fri: 8am-4pm [address], [place], [place] [phone](IHWTU); [phone](fax) www.p[email or web address]  *Accepts Medicaid  [facility] [place] Same day access  hours: Mon-Fri, 8:30-3pm Crisis hours: Mon-Fri, 8am-5pm [address], [place], [place] [phone]  [facility] Same day access hours: Mon-Fri, 8:30-3pm Crisis hours: Mon-Fri, 8am-8pm [address], [place], [place] [phone] (phone); [phone] (fax) www.[email or web address]  *Accepts Medicaid and Medicare  [facility] Mon, Wed, Fri: 9am-9pm Tues, Thurs: 9am-6pm [address], [place], [place]  [phone] (phone); [phone] (fax) [email or web address]://[email or web address]  *(Accepts Medicare and Medicaid; payment plans available)*Bilingual services available  [facility] [address], [place], [place] [phone] (phone); [phone] (fax) www.[email or web address]   [facility] [address], [place], [place]  [phone]  [email or web address]  *Bilingual services available  [facility] Mon-Thurs: 8am-8pm [address], [place], [place] [phone] (phone); [phone] (fax) [email or web address]  *Accepts Medicaid*Bilingual services available  [facility] [address] [place], [place] [phone] (phone) [email or web address]://[email or web address]  *Accepts Medicaid *Bilingual services available  [facility] Mon-Fri, 9am-4:45pm [address], [place], [place] [phone] (phone); [phone]  (fax) [email or web address]://[email or web address]  *Accepts Medicare  [facility] Mon-Thurs: 8:30-8pm, Fri: 8:30am-7pm [address], [place], [place] (3rd floor) [phone] (phone); [phone] (fax) [email or web address]  *Accepts Medicaid; income-based reduced rates available  [facility] Mon-Fri: 8am-5pm [address], [place], [place] [address] [phone] (phone); [phone] (fax) [email or web address]://[email or web address]  *Accepts Medicaid*Bilingual services available   [facility] ([facility] [place])  [address], [place] [phone] www.mhag.org  *Provides direct services to individuals in recovery from mental illness, including support groups, recovery skills classes, and one on one peer support  NAMI (National Alliance on Mental Illness) [place] NAMI helpline: 336-370-4264  NAMI [place] helpline: 1-800-451-9682 https://namiguilford.org  *A community hub for information relating to local resources and services for the friends and families of individuals living alongside a mental health condition, as well as the individuals themselves. Classes and support groups also provided   /Emotional Wellbeing Apps and Websites Here are a few free apps meant to help you to help yourself.  To find, try searching on the internet to see if the app is offered on Apple/Android devices. If your first choice doesn't come up on your device, the good news is that there are many choices! Play around with different apps to see which ones are helpful to you.    Calm This is an app meant to help increase calm feelings. Includes info, strategies, and tools for tracking your feelings.      Calm Harm  This app is meant to help with self-harm. Provides many 5-minute or 15-min coping strategies for doing instead of hurting yourself.       Healthy Minds Health Minds is a problem-solving tool to help deal with emotions and cope with stress you  encounter wherever you are.      MindShift This app can help people cope with anxiety. Rather than trying to avoid anxiety, you can make an important shift and face it.      MY3  MY3 features a support system, safety plan and resources with the goal of offering a tool to use in a time of need.       My Life My Voice  This mood journal offers a simple solution for tracking your thoughts, feelings and moods. Animated emoticons can help identify your mood.       Relax Melodies Designed to help with sleep, on this app you can mix sounds and meditations for relaxation.      Smiling Mind Smiling Mind is meditation made easy: it's   it's a simple tool that helps put a smile on your mind.        Stop, Breathe & Think  A friendly, simple guide for people through meditations for mindfulness and compassion.  Stop, Breathe and Think Kids Enter your current feelings and choose a mission to help you cope. Offers videos for certain moods instead of just sound recordings.       Team Orange The goal of this tool is to help teens change how they think, act, and react. This app helps you focus on your own good feelings and experiences.      The United Stationers Box The United Stationers Box (VHB) contains simple tools to help patients with coping, relaxation, distraction, and positive thinking.

## 2020-09-18 ENCOUNTER — Ambulatory Visit (INDEPENDENT_AMBULATORY_CARE_PROVIDER_SITE_OTHER): Payer: Medicaid Other | Admitting: Licensed Clinical Social Worker

## 2020-09-18 ENCOUNTER — Other Ambulatory Visit: Payer: Self-pay

## 2020-09-18 DIAGNOSIS — F418 Other specified anxiety disorders: Secondary | ICD-10-CM
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
## 2020-10-19 ENCOUNTER — Telehealth (HOSPITAL_COMMUNITY): Payer: Medicaid Other | Admitting: Psychiatry

## 2020-10-19 ENCOUNTER — Encounter (HOSPITAL_COMMUNITY): Payer: Self-pay

## 2020-10-19 ENCOUNTER — Ambulatory Visit (HOSPITAL_COMMUNITY)
Admission: EM | Admit: 2020-10-19 | Discharge: 2020-10-19 | Disposition: A | Discharge status: Home / Self Care | Payer: Medicaid Other | Attending: Emergency Medicine | Admitting: Emergency Medicine

## 2020-10-19 ENCOUNTER — Other Ambulatory Visit: Payer: Self-pay

## 2020-10-19 DIAGNOSIS — M545 Low back pain, unspecified: Secondary | ICD-10-CM

## 2020-10-19 MED ORDER — IBUPROFEN 800 MG PO TABS: 800.0000 mg | ORAL_TABLET | Freq: Three times a day (TID) | ORAL | 0 refills | Status: DC | PRN

## 2020-10-19 MED ORDER — METHOCARBAMOL 500 MG PO TABS: 500.0000 mg | ORAL_TABLET | Freq: Two times a day (BID) | ORAL | 0 refills | Status: DC

## 2020-10-19 NOTE — Discharge Instructions (Addendum)
Take the prescribed ibuprofen as needed for your pain.  Take the muscle relaxer as needed for muscle spasm; Do not drive, operate machinery, or drink alcohol with this medication as it may make you drowsy.    Follow up with your primary care provider or an orthopedist if your pain is not improving.       

## 2020-10-19 NOTE — ED Triage Notes (Signed)
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

## 2020-10-19 NOTE — ED Provider Notes (Signed)
[facility]    CSN: [id number] Arrival date & time: 10/19/20  9628      History   Chief Complaint Chief Complaint  Patient presents with  . Back Pain    HPI Katie Ross is a 34 y.o. female.  Patient presents with 1 week history of bilateral lower back pain. 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 The pain is currently 8/10, worse with ambulation and standing, improves with rest and ibuprofen, nonradiating. She denies weakness, numbness, fever, chills, abdominal pain, dysuria, vaginal symptoms, rash, lesions, redness, bruising, or other symptoms. Her medical history includes anemia.  The history is provided by the patient and medical records.    Past Medical History:  Diagnosis Date  . Anemia   . Encounter for insertion of progestin-releasing intrauterine contraceptive device (IUD) 09/27/2019   Liletta inserted 09/27/19    Patient Active Problem List   Diagnosis Date Noted  . IUD check up 10/29/2019  . Trichomoniasis 10/29/2019  . Visit for routine gyn exam 03/30/2018  . Menorrhagia 02/21/2016    Past Surgical History:  Procedure Laterality Date  . CESAREAN SECTION     x3  . HERNIA REPAIR      OB History    Gravida  3   Para  3   Term  3   Preterm  0   AB  0   Living  3     SAB  0   IAB  0   Ectopic  0   Multiple  0   Live Births  3            Home Medications    Prior to Admission medications   Medication Sig Start Date End Date Taking? Authorizing Provider  ibuprofen (ADVIL) 800 MG tablet Take 1 tablet (800 mg total) by mouth every 8 (eight) hours as needed. 10/19/20   Mickie Bail, NP  methocarbamol (ROBAXIN) 500 MG tablet Take 1 tablet (500 mg total) by mouth 2 (two) times daily. 10/19/20   Mickie Bail, NP  metroNIDAZOLE (FLAGYL) 500 MG tablet Take 1 tablet (500 mg total) by mouth 2 (two) times daily. 07/27/20   Clare Bing, MD    Family History History reviewed. No pertinent family  history.  Social History Social History   Tobacco Use  . Smoking status: Current Every Day Smoker    Packs/day: 3.00    Types: Cigars  . Smokeless tobacco: Never Used  Substance Use Topics  . Alcohol use: Yes    Comment: occ  . Drug use: No     Allergies   Patient has no known allergies.   Review of Systems Review of Systems  Constitutional: Negative for chills and fever.  HENT: Negative for ear pain and sore throat.   Eyes: Negative for pain and visual disturbance.  Respiratory: Negative for cough and shortness of breath.   Cardiovascular: Negative for chest pain and palpitations.  Gastrointestinal: Negative for abdominal pain, nausea and vomiting.  Genitourinary: Negative for dysuria and hematuria.  Musculoskeletal: Positive for back pain. Negative for arthralgias.  Skin: Negative for color change and rash.  Neurological: Negative for seizures, syncope, weakness and numbness.  All other systems reviewed and are negative.    Physical Exam Triage Vital Signs ED Triage Vitals  Enc Vitals Group     BP 10/19/20 0954 (!) 147/80     Pulse Rate 10/19/20 0954 62     Resp 10/19/20 0954 18  Temp 10/19/20 0954 98.7 F (37.1 C)     Temp Source 10/19/20 0954 Oral     SpO2 10/19/20 0954 100 %     Weight --      Height --      Head Circumference --      Peak Flow --      Pain Score 10/19/20 0953 8     Pain Loc --      Pain Edu? --      Excl. in GC? --    No data found.  Updated Vital Signs BP (!) 147/80 (BP Location: Right Arm)   Pulse 62   Temp 98.7 F (37.1 C) (Oral)   Resp 18   SpO2 100%   Visual Acuity Right Eye Distance:   Left Eye Distance:   Bilateral Distance:    Right Eye Near:   Left Eye Near:    Bilateral Near:     Physical Exam Vitals and nursing note reviewed.  Constitutional:      General: She is not in acute distress.    Appearance: She is well-developed and well-nourished. She is obese.  HENT:     Head: Normocephalic and  atraumatic.     Mouth/Throat:     Mouth: Mucous membranes are moist.     Pharynx: Oropharynx is clear.  Eyes:     Conjunctiva/sclera: Conjunctivae normal.  Cardiovascular:     Rate and Rhythm: Normal rate and regular rhythm.     Heart sounds: Normal heart sounds.  Pulmonary:     Effort: Pulmonary effort is normal. No respiratory distress.     Breath sounds: Normal breath sounds.  Abdominal:     Palpations: Abdomen is soft.     Tenderness: There is no abdominal tenderness. There is no guarding or rebound.  Musculoskeletal:        General: No swelling, tenderness, deformity, signs of injury or edema. Normal range of motion.     Cervical back: Neck supple.  Skin:    General: Skin is warm and dry.     Findings: No bruising, erythema, lesion or rash.  Neurological:     General: No focal deficit present.     Mental Status: She is alert and oriented to person, place, and time.     Sensory: No sensory deficit.     Motor: No weakness.     Gait: Gait normal.     Comments: Negative straight leg raise.  Psychiatric:        Mood and Affect: Mood and affect and mood normal.        Behavior: Behavior normal.      UC Treatments / Results  Labs (all labs ordered are listed, but only abnormal results are displayed) Labs Reviewed - No data to display  EKG   Radiology No results found.  Procedures Procedures (including critical care time)  Medications Ordered in UC Medications - No data to display  Initial Impression / Assessment and Plan / UC Course  I have reviewed the triage vital signs and the nursing notes.  Pertinent labs & imaging results that were available during my care of the patient were reviewed by me and considered in my medical decision making (see chart for details).   Acute bilateral low back pain without sciatica.  Treating with ibuprofen and Robaxin.  Precautions for drowsiness with Robaxin discussed.  Instructed patient to follow-up with her PCP or an  orthopedist if her symptoms are not improving.  Patient agrees to plan of  care.   Final Clinical Impressions(s) / UC Diagnoses   Final diagnoses:  Acute bilateral low back pain without sciatica     Discharge Instructions     Take the prescribed ibuprofen as needed for your pain.  Take the muscle relaxer as needed for muscle spasm; Do not drive, operate machinery, or drink alcohol with this medication as it may make you drowsy.    Follow up with your primary care provider or an orthopedist if your pain is not improving.        ED Prescriptions    Medication Sig Dispense Auth. Provider   ibuprofen (ADVIL) 800 MG tablet Take 1 tablet (800 mg total) by mouth every 8 (eight) hours as needed. 21 tablet Mickie Bail, NP   methocarbamol (ROBAXIN) 500 MG tablet Take 1 tablet (500 mg total) by mouth 2 (two) times daily. 20 tablet Mickie Bail, NP     PDMP not reviewed this encounter.   Mickie Bail, NP 10/19/20 1030

## 2020-11-09 ENCOUNTER — Ambulatory Visit (HOSPITAL_COMMUNITY): Payer: Medicaid Other | Admitting: Licensed Clinical Social Worker

## 2020-11-09 ENCOUNTER — Other Ambulatory Visit: Payer: Self-pay

## 2020-11-09 ENCOUNTER — Telehealth (HOSPITAL_COMMUNITY): Payer: Self-pay | Admitting: Licensed Clinical Social Worker

## 2020-11-09 NOTE — Telephone Encounter (Signed)
LCSW sent text link for video session per schedule. Pt is delayed signing on and states she is at work. 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 LCSW reviewed next appt time/date. Pt states she will be available for that appt and is apologetic.

## 2020-11-23 ENCOUNTER — Other Ambulatory Visit: Payer: Self-pay

## 2020-11-23 ENCOUNTER — Ambulatory Visit
Admission: RE | Admit: 2020-11-23 | Discharge: 2020-11-23 | Disposition: A | Discharge status: Home / Self Care | Payer: Medicaid Other | Source: Ambulatory Visit | Attending: Nurse Practitioner | Admitting: Nurse Practitioner

## 2020-11-23 ENCOUNTER — Telehealth (HOSPITAL_COMMUNITY): Payer: Self-pay | Admitting: Licensed Clinical Social Worker

## 2020-11-23 ENCOUNTER — Ambulatory Visit (HOSPITAL_COMMUNITY): Payer: Medicaid Other | Admitting: Licensed Clinical Social Worker

## 2020-11-23 ENCOUNTER — Other Ambulatory Visit: Payer: Self-pay | Admitting: Nurse Practitioner

## 2020-11-23 DIAGNOSIS — M85671 Other cyst of bone, right ankle and foot: Secondary | ICD-10-CM

## 2020-11-23 NOTE — Telephone Encounter (Signed)
LCSW sent text link for video session per schedule. When pt failed to sign on LCSW called pt. Phone rang several times before going to vm. LCSW left detailed vm re purpose of call with call back # for pt use as desired r/t future appts.

## 2020-11-25 ENCOUNTER — Other Ambulatory Visit: Payer: Self-pay

## 2020-12-19 ENCOUNTER — Ambulatory Visit (HOSPITAL_COMMUNITY): Payer: Medicaid Other | Admitting: Licensed Clinical Social Worker

## 2021-02-05 ENCOUNTER — Other Ambulatory Visit (HOSPITAL_COMMUNITY)
Admission: RE | Admit: 2021-02-05 | Discharge: 2021-02-05 | Disposition: A | Discharge status: Home / Self Care | Payer: Medicaid Other | Source: Ambulatory Visit | Attending: Obstetrics and Gynecology | Admitting: Obstetrics and Gynecology

## 2021-02-05 ENCOUNTER — Encounter: Payer: Self-pay | Admitting: Family Medicine

## 2021-02-05 ENCOUNTER — Ambulatory Visit (INDEPENDENT_AMBULATORY_CARE_PROVIDER_SITE_OTHER): Payer: Medicaid Other | Admitting: Family Medicine

## 2021-02-05 ENCOUNTER — Other Ambulatory Visit: Payer: Self-pay

## 2021-02-05 VITALS — BP 146/94 | HR 65 | Ht 64.0 in | Wt 278.1 lb

## 2021-02-05 DIAGNOSIS — Z30431 Encounter for routine checking of intrauterine contraceptive device: Secondary | ICD-10-CM

## 2021-02-05 DIAGNOSIS — A599 Trichomoniasis, unspecified: Secondary | ICD-10-CM

## 2021-02-05 DIAGNOSIS — Z113 Encounter for screening for infections with a predominantly sexual mode of transmission: Secondary | ICD-10-CM | POA: Diagnosis not present

## 2021-02-05 DIAGNOSIS — Z975 Presence of (intrauterine) contraceptive device: Secondary | ICD-10-CM

## 2021-02-05 DIAGNOSIS — Z01419 Encounter for gynecological examination (general) (routine) without abnormal findings: Secondary | ICD-10-CM

## 2021-02-05 NOTE — Patient Instructions (Signed)
IUD EXPIRES 03/17/2024--->REPLACE BEFORE THIS DATE Return in 1 year for annual gyn exam We will let you know your lab results on myChart

## 2021-02-05 NOTE — Progress Notes (Signed)
GYNECOLOGY ANNUAL PREVENTATIVE CARE ENCOUNTER NOTE  History:     Katie Ross is a 35 y.o. [id number] female here for a routine annual gynecologic exam.  Current complaints: abnormal vaginal odor.   Denies abnormal vaginal bleeding, discharge, pelvic pain, problems with intercourse or other gynecologic concerns. No new sexual partners. Trichomonas 07/2020 and desires repeat testing. Desires STI testing and IUD string check today.   Gynecologic History No LMP recorded. (Menstrual status: IUD). Contraception: IUD, placed 03/17/2018 Last Pap: 07/15/2019. Results were: normal with negative HPV Last mammogram: not done given patient age  Obstetric History OB History  Gravida Para Term Preterm AB Living  3 3 3  0 0 3  SAB IAB Ectopic Multiple Live Births  0 0 0 0 3    # Outcome Date GA Lbr Len/2nd Weight Sex Delivery Anes PTL Lv  3 Term      CS-LTranv   LIV  2 Term      CS-LTranv   LIV  1 Term      CS-LTranv   LIV    Past Medical History:  Diagnosis Date  . Anemia   . Encounter for insertion of progestin-releasing intrauterine contraceptive device (IUD) 09/27/2019   Liletta inserted 09/27/19    Past Surgical History:  Procedure Laterality Date  . CESAREAN SECTION     x3  . HERNIA REPAIR      Current Outpatient Medications on File Prior to Visit  Medication Sig Dispense Refill  . ibuprofen (ADVIL) 800 MG tablet Take 1 tablet (800 mg total) by mouth every 8 (eight) hours as needed. 21 tablet 0  . methocarbamol (ROBAXIN) 500 MG tablet Take 1 tablet (500 mg total) by mouth 2 (two) times daily. (Patient not taking: Reported on 02/05/2021) 20 tablet 0  . metroNIDAZOLE (FLAGYL) 500 MG tablet Take 1 tablet (500 mg total) by mouth 2 (two) times daily. 14 tablet 0   No current facility-administered medications on file prior to visit.    No Known Allergies  Social History:  reports that she has been smoking cigars. She has been smoking about 3.00 packs per day. She has never used  smokeless tobacco. She reports current alcohol use. She reports that she does not use drugs.  No family history on file.  The following portions of the patient's history were reviewed and updated as appropriate: allergies, current medications, past family history, past medical history, past social history, past surgical history and problem list.  Review of Systems Pertinent items noted in HPI and remainder of comprehensive ROS otherwise negative.  Physical Exam:  BP (!) 146/94 (BP Location: Right Arm)   Pulse 65   Ht 5\' 4"  (1.626 m)   Wt 278 lb 1.6 oz (126.1 kg)   BMI 47.74 kg/m  CONSTITUTIONAL: Well-developed, well-nourished female in no acute distress.  HENT:  Normocephalic, atraumatic, External right and left ear normal. Oropharynx is clear and moist EYES: Conjunctivae and EOM are normal. Pupils are equal, round. No scleral icterus.  NECK: Normal range of motion, supple, no masses. SKIN: Skin is warm and dry. No rash noted. Not diaphoretic. No erythema. No pallor. MUSCULOSKELETAL: Normal range of motion. No tenderness.  No cyanosis, clubbing, or edema.  2+ distal pulses. NEUROLOGIC: Alert and oriented to person, place, and time. Normal reflexes, muscle tone coordination.  PSYCHIATRIC: Normal mood and affect. Normal behavior. Normal judgment and thought content. CARDIOVASCULAR: Normal heart rate noted RESPIRATORY: Effort normal, no problems with respiration noted. BREASTS: Symmetric in size. No masses, tenderness,  skin changes, nipple drainage, or lymphadenopathy bilaterally. Bilateral nipple piercings noted. Performed in the presence of a chaperone. ABDOMEN: Soft, no distention noted.  No tenderness, rebound or guarding.  PELVIC: Normal appearing external genitalia and urethral meatus; normal appearing vaginal mucosa and cervix. Normal uterine size, no other palpable masses, no uterine or adnexal tenderness.  Frothy pink discharge noted on exam. No IUD strings visualized at cervical  os. Performed in the presence of a chaperone.   Assessment and Plan:      Katie Ross was seen today for gynecologic exam.  Diagnoses and all orders for this visit:  Encounter for gynecological examination without abnormal finding -Concerns as noted below -BP elevated today, encouraged to follow up with primary care provider  IUD check up -No IUD strings visualized at cervical os, transabdominal ultrasound performed with visualization of strings.  Trichomoniasis +07/2020 was treated. Desires repeat testing.  Encounter for screening for bacterial sexually transmitted disease Abnormal vaginal odor per patient, history of BV and trich. Frothy pink discharge noted on speculum exam. Patient desires full STI testing.  -     HIV antibody (with reflex) -     RPR -     Cervicovaginal ancillary only( [place]) -     Hepatitis B Surface AntiGEN -     Hepatitis C Antibody  IUD (intrauterine device) in place     Routine preventative health maintenance measures emphasized. Please refer to After Visit Summary for other counseling recommendations.      Alric Seton, MD OB Fellow, Faculty [facility], [facility] 02/05/2021 8:59 AM

## 2021-02-06 LAB — CERVICOVAGINAL ANCILLARY ONLY
Bacterial Vaginitis (gardnerella): POSITIVE — AB
Candida Glabrata: NEGATIVE
Candida Vaginitis: NEGATIVE
Chlamydia: NEGATIVE
Comment: NEGATIVE
Comment: NEGATIVE
Comment: NEGATIVE
Comment: NEGATIVE
Comment: NEGATIVE
Comment: NORMAL
Neisseria Gonorrhea: NEGATIVE
Trichomonas: NEGATIVE

## 2021-02-07 LAB — HEPATITIS C ANTIBODY: Hep C Virus Ab: <0.1 s/co ratio (ref 0.0–0.9)

## 2021-02-07 LAB — RPR: RPR Ser Ql: NONREACTIVE

## 2021-02-07 LAB — HEPATITIS B SURFACE ANTIGEN: Hepatitis B Surface Ag: NEGATIVE

## 2021-02-07 LAB — HIV ANTIBODY (ROUTINE TESTING W REFLEX): HIV Screen 4th Generation wRfx: NONREACTIVE

## 2021-03-22 ENCOUNTER — Other Ambulatory Visit: Payer: Self-pay

## 2021-03-22 ENCOUNTER — Encounter: Payer: Self-pay | Admitting: Podiatry

## 2021-03-22 ENCOUNTER — Ambulatory Visit (INDEPENDENT_AMBULATORY_CARE_PROVIDER_SITE_OTHER): Payer: Medicaid Other

## 2021-03-22 ENCOUNTER — Ambulatory Visit: Payer: Medicaid Other | Admitting: Podiatry

## 2021-03-22 DIAGNOSIS — R234 Changes in skin texture: Secondary | ICD-10-CM

## 2021-03-22 DIAGNOSIS — L0889 Other specified local infections of the skin and subcutaneous tissue: Secondary | ICD-10-CM

## 2021-03-22 DIAGNOSIS — L853 Xerosis cutis: Secondary | ICD-10-CM | POA: Diagnosis not present

## 2021-03-22 DIAGNOSIS — M25571 Pain in right ankle and joints of right foot: Secondary | ICD-10-CM

## 2021-03-22 MED ORDER — AMMONIUM LACTATE 12 % EX LOTN: 1.0000 "application " | TOPICAL_LOTION | CUTANEOUS | 0 refills | Status: DC | PRN

## 2021-03-23 ENCOUNTER — Encounter: Payer: Self-pay | Admitting: Podiatry

## 2021-03-23 NOTE — Progress Notes (Signed)
  Subjective:  Patient ID: Katie Ross, female    DOB: 12-04-1985,  MRN: [id number]  Chief Complaint  Patient presents with  . Foot Pain    Bilateral foot pain and ankle pain     35 y.o. female presents with the above complaint.  Patient complains of bilateral severe heel pain with underlying severe dryness.  Patient states that she has not been very good with moisturizing.  She states it started to hurt and start causing cracks in the skin with leading to more pain.  She states that she is not a diabetic.  She would like to discuss treatment options for it.  She has not tried any kind of prescription lotion.  She states is burning and swelling is painful to walk.  Her pain scale 7 out of 10.   Review of Systems: Negative except as noted in the HPI. Denies N/V/F/Ch.  Past Medical History:  Diagnosis Date  . Anemia   . Encounter for insertion of progestin-releasing intrauterine contraceptive device (IUD) 09/27/2019   Liletta inserted 09/27/19    Current Outpatient Medications:  .  ammonium lactate (AMLACTIN) 12 % lotion, Apply 1 application topically as needed for dry skin., Disp: 400 g, Rfl: 0 .  ibuprofen (ADVIL) 800 MG tablet, Take 1 tablet (800 mg total) by mouth every 8 (eight) hours as needed., Disp: 21 tablet, Rfl: 0  Social History   Tobacco Use  Smoking Status Current Every Day Smoker  . Packs/day: 3.00  . Types: Cigars  Smokeless Tobacco Never Used    No Known Allergies Objective:  There were no vitals filed for this visit. There is no height or weight on file to calculate BMI. Constitutional Well developed. Well nourished.  Vascular Dorsalis pedis pulses palpable bilaterally. Posterior tibial pulses palpable bilaterally. Capillary refill normal to all digits.  No cyanosis or clubbing noted. Pedal hair growth normal.  Neurologic Normal speech. Oriented to person, place, and time. Epicritic sensation to light touch grossly present bilaterally.   Dermatologic  hyperkeratotic skin with severe dryness/xerosis with underlying fissure noted.  No ulceration noted no clinical signs of infection noted.  Orthopedic: Normal joint ROM without pain or crepitus bilaterally. No visible deformities. No bony tenderness.   Radiographs: 3 views of skeletally mature adult bilateral ankle:No osteoarthritic changes noted of the ankle joint.  Well aligned and positioned ankle joint noted.  No fractures noted.  No other bony abnormalities identified. Assessment:   1. Xerosis cutis   2. Skin fissure    Plan:  Patient was evaluated and treated and all questions answered.  Bilateral severe xerosis -I explained to the patient the etiology of xerosis and various treatment options were extensively discussed.  I explained to the patient the importance of maintaining moisturization of the skin with application of over-the-counter lotion such as Eucerin or Luciderm.  Given that she has failed all the over-the-counter medication she will benefit from ammonium lactate.  Ammonium lactate was sent to this pharmacy.  I have asked her to apply twice a day. -Dermabond was utilized to repair the fissure skin   No follow-ups on file.

## 2021-11-20 ENCOUNTER — Other Ambulatory Visit (HOSPITAL_COMMUNITY)
Admission: RE | Admit: 2021-11-20 | Discharge: 2021-11-20 | Disposition: A | Discharge status: Home / Self Care | Payer: Medicaid Other | Source: Ambulatory Visit | Attending: Family Medicine | Admitting: Family Medicine

## 2021-11-20 ENCOUNTER — Ambulatory Visit (INDEPENDENT_AMBULATORY_CARE_PROVIDER_SITE_OTHER): Payer: Medicaid Other

## 2021-11-20 ENCOUNTER — Other Ambulatory Visit: Payer: Self-pay

## 2021-11-20 VITALS — BP 148/84 | HR 67 | Wt 283.1 lb

## 2021-11-20 DIAGNOSIS — R8290 Unspecified abnormal findings in urine: Secondary | ICD-10-CM | POA: Diagnosis not present

## 2021-11-20 DIAGNOSIS — Z113 Encounter for screening for infections with a predominantly sexual mode of transmission: Secondary | ICD-10-CM

## 2021-11-20 DIAGNOSIS — R350 Frequency of micturition: Secondary | ICD-10-CM | POA: Diagnosis not present

## 2021-11-20 DIAGNOSIS — N898 Other specified noninflammatory disorders of vagina: Secondary | ICD-10-CM | POA: Insufficient documentation

## 2021-11-20 LAB — POCT URINALYSIS DIP (DEVICE)
Bilirubin Urine: NEGATIVE
Glucose, UA: NEGATIVE mg/dL
Ketones, ur: NEGATIVE mg/dL
Leukocytes,Ua: NEGATIVE
Nitrite: NEGATIVE
Protein, ur: NEGATIVE mg/dL
Specific Gravity, Urine: 1.025 (ref 1.005–1.030)
Urobilinogen, UA: 1 mg/dL (ref 0.0–1.0)
pH: 7 (ref 5.0–8.0)

## 2021-11-20 NOTE — Progress Notes (Signed)
Patient seen today with concerns for malodorous discharge. Patient also states she has frequency with urination and cloudy urine. Patient instructed on how to collect a self swab. Patient requested STD testing on swab as well as STD blood testing. Patient instructed she will be notified of any abnormal results. Patient denies any concerns or questions. Patient's blood pressure today was elevated. Per patient her blood pressure has been higher lately. Patient denies any symptoms. Patient denies taking any medication for blood pressure. I advised patient to follow up with her PCP about her blood pressure.   Alesia Richards, RN 11/20/21

## 2021-11-21 ENCOUNTER — Encounter: Payer: Self-pay | Admitting: Family Medicine

## 2021-11-21 ENCOUNTER — Other Ambulatory Visit: Payer: Self-pay | Admitting: Family Medicine

## 2021-11-21 LAB — HEPATITIS C ANTIBODY: Hep C Virus Ab: <0.1 s/co ratio (ref 0.0–0.9)

## 2021-11-21 LAB — CERVICOVAGINAL ANCILLARY ONLY
Bacterial Vaginitis (gardnerella): POSITIVE — AB
Candida Glabrata: NEGATIVE
Candida Vaginitis: NEGATIVE
Chlamydia: NEGATIVE
Comment: NEGATIVE
Comment: NEGATIVE
Comment: NEGATIVE
Comment: NEGATIVE
Comment: NEGATIVE
Comment: NORMAL
Neisseria Gonorrhea: NEGATIVE
Trichomonas: NEGATIVE

## 2021-11-21 LAB — HEPATITIS B SURFACE ANTIGEN: Hepatitis B Surface Ag: NEGATIVE

## 2021-11-21 LAB — HIV ANTIBODY (ROUTINE TESTING W REFLEX): HIV Screen 4th Generation wRfx: NONREACTIVE

## 2021-11-21 LAB — RPR: RPR Ser Ql: NONREACTIVE

## 2021-11-21 MED ORDER — METRONIDAZOLE 500 MG PO TABS: 500.0000 mg | ORAL_TABLET | Freq: Two times a day (BID) | ORAL | 0 refills | Status: AC

## 2021-11-21 NOTE — Progress Notes (Signed)
Chart reviewed for nurse visit. Agree with plan of care.   Warner Mccreedy, MD 11/21/2021 2:09 PM

## 2021-11-22 LAB — URINE CULTURE

## 2022-04-01 ENCOUNTER — Other Ambulatory Visit: Payer: Self-pay | Admitting: Family Medicine

## 2022-04-01 ENCOUNTER — Ambulatory Visit
Admission: RE | Admit: 2022-04-01 | Discharge: 2022-04-01 | Disposition: A | Discharge status: Home / Self Care | Payer: Medicaid Other | Source: Ambulatory Visit | Attending: Family Medicine | Admitting: Family Medicine

## 2022-04-01 DIAGNOSIS — R52 Pain, unspecified: Secondary | ICD-10-CM

## 2022-04-09 ENCOUNTER — Ambulatory Visit (INDEPENDENT_AMBULATORY_CARE_PROVIDER_SITE_OTHER): Payer: Medicaid Other

## 2022-04-09 ENCOUNTER — Other Ambulatory Visit (HOSPITAL_COMMUNITY)
Admission: RE | Admit: 2022-04-09 | Discharge: 2022-04-09 | Disposition: A | Discharge status: Home / Self Care | Payer: Medicaid Other | Source: Ambulatory Visit | Attending: Family Medicine | Admitting: Family Medicine

## 2022-04-09 VITALS — BP 138/85 | HR 72 | Wt 283.6 lb

## 2022-04-09 DIAGNOSIS — Z113 Encounter for screening for infections with a predominantly sexual mode of transmission: Secondary | ICD-10-CM | POA: Insufficient documentation

## 2022-04-09 DIAGNOSIS — N898 Other specified noninflammatory disorders of vagina: Secondary | ICD-10-CM

## 2022-04-09 NOTE — Progress Notes (Signed)
Patient here today requesting STD testing. Patient also has complaint of malodorous vaginal discharge. I instructed patient on how to collect self swab. Self swab collected without issue. I informed patient we would notify her with any abnormal results. Patient verbalized understanding and denies any other questions.  Alesia Richards, RN 04/09/22

## 2022-04-10 ENCOUNTER — Other Ambulatory Visit: Payer: Self-pay | Admitting: Family Medicine

## 2022-04-10 LAB — CERVICOVAGINAL ANCILLARY ONLY
Bacterial Vaginitis (gardnerella): POSITIVE — AB
Candida Glabrata: NEGATIVE
Candida Vaginitis: NEGATIVE
Chlamydia: NEGATIVE
Comment: NEGATIVE
Comment: NEGATIVE
Comment: NEGATIVE
Comment: NEGATIVE
Comment: NEGATIVE
Comment: NORMAL
Neisseria Gonorrhea: NEGATIVE
Trichomonas: NEGATIVE

## 2022-04-10 LAB — RPR: RPR Ser Ql: NONREACTIVE

## 2022-04-10 LAB — HEPATITIS B SURFACE ANTIGEN: Hepatitis B Surface Ag: NEGATIVE

## 2022-04-10 LAB — HIV ANTIBODY (ROUTINE TESTING W REFLEX): HIV Screen 4th Generation wRfx: NONREACTIVE

## 2022-04-10 LAB — HEPATITIS C ANTIBODY: Hep C Virus Ab: NONREACTIVE

## 2022-04-10 MED ORDER — METRONIDAZOLE 500 MG PO TABS: 500.0000 mg | ORAL_TABLET | Freq: Two times a day (BID) | ORAL | 0 refills | Status: AC

## 2022-05-29 DIAGNOSIS — G8929 Other chronic pain: Secondary | ICD-10-CM | POA: Insufficient documentation

## 2022-05-29 DIAGNOSIS — R768 Other specified abnormal immunological findings in serum: Secondary | ICD-10-CM | POA: Insufficient documentation

## 2022-08-27 ENCOUNTER — Other Ambulatory Visit: Payer: Self-pay | Admitting: Podiatry

## 2022-10-22 DIAGNOSIS — Z947 Corneal transplant status: Secondary | ICD-10-CM | POA: Insufficient documentation

## 2023-02-19 DIAGNOSIS — H04123 Dry eye syndrome of bilateral lacrimal glands: Secondary | ICD-10-CM | POA: Insufficient documentation

## 2023-02-27 ENCOUNTER — Ambulatory Visit
Admission: RE | Admit: 2023-02-27 | Discharge: 2023-02-27 | Disposition: A | Discharge status: Home / Self Care | Payer: Medicaid Other | Source: Ambulatory Visit | Attending: Family Medicine | Admitting: Family Medicine

## 2023-02-27 VITALS — BP 144/106 | HR 65 | Temp 97.8°F | Resp 18

## 2023-02-27 DIAGNOSIS — M79604 Pain in right leg: Secondary | ICD-10-CM

## 2023-02-27 DIAGNOSIS — M7989 Other specified soft tissue disorders: Secondary | ICD-10-CM | POA: Diagnosis not present

## 2023-02-27 MED ORDER — METHYLPREDNISOLONE 4 MG PO TBPK: ORAL_TABLET | ORAL | 0 refills | Status: DC

## 2023-02-27 MED ORDER — GABAPENTIN 100 MG PO CAPS: 100.0000 mg | ORAL_CAPSULE | Freq: Two times a day (BID) | ORAL | 0 refills | Status: AC

## 2023-02-27 MED ORDER — KETOROLAC TROMETHAMINE 15 MG/ML IJ SOLN
30.0000 mg | Freq: Once | INTRAMUSCULAR | Status: AC
  Administered 2023-02-27: 30 mg via INTRAMUSCULAR

## 2023-02-27 NOTE — ED Provider Notes (Signed)
[facility]    CSN: [id number] Arrival date & time: 02/27/23  1249      History   Chief Complaint Chief Complaint  Patient presents with   Leg Pain    HPI Katie Ross is a 37 y.o. female.    Leg Pain  Here for pain in her entire right leg.  It started last week or about 7 or 8 days ago.  No fever or chills and no rash.  No fall or trauma.  The foot feels like it is tingling.  She is very afraid that it might be a blood clot in her leg.  She has been evaluated for lupus, but is not clear if she has that.  She takes [employer].  She has an IUD in place   She has been told previously that she has sciatica  She feels that her right leg is swollen.  Past Medical History:  Diagnosis Date   Anemia    Encounter for insertion of progestin-releasing intrauterine contraceptive device (IUD) 09/27/2019   Liletta inserted 09/27/19    Patient Active Problem List   Diagnosis Date Noted   IUD (intrauterine device) in place 02/05/2021   IUD check up 10/29/2019   Trichomoniasis 10/29/2019   Visit for routine gyn exam 03/30/2018    Past Surgical History:  Procedure Laterality Date   CESAREAN SECTION     x3   HERNIA REPAIR      OB History     Gravida  3   Para  3   Term  3   Preterm  0   AB  0   Living  3      SAB  0   IAB  0   Ectopic  0   Multiple  0   Live Births  3            Home Medications    Prior to Admission medications   Medication Sig Start Date End Date Taking? Authorizing Provider  gabapentin (NEURONTIN) 100 MG capsule Take 1 capsule (100 mg total) by mouth 2 (two) times daily. 02/27/23  Yes Zenia Resides, MD  methylPREDNISolone (MEDROL DOSEPAK) 4 MG TBPK tablet Take as per package instructions 02/27/23  Yes Roddrick Sharron, Janace Aris, MD  VRAYLAR 1.5 MG capsule Take 1.5 mg by mouth daily. 12/27/22  Yes [provider]    Family History No family history on file.  Social History Social History   Tobacco  Use   Smoking status: Every Day    Packs/day: 3    Types: Cigars, Cigarettes   Smokeless tobacco: Never  Substance Use Topics   Alcohol use: Yes    Comment: occ   Drug use: No     Allergies   Sulfamethoxazole   Review of Systems Review of Systems   Physical Exam Triage Vital Signs ED Triage Vitals [02/27/23 1332]  Enc Vitals Group     BP (!) 144/106     Pulse Rate 65     Resp 18     Temp 97.8 F (36.6 C)     Temp Source Oral     SpO2 98 %     Weight      Height      Head Circumference      Peak Flow      Pain Score 8     Pain Loc      Pain Edu?      Excl. in GC?    No data found.  Updated Vital Signs BP (!) 144/106 (BP Location: Left Arm)   Pulse 65   Temp 97.8 F (36.6 C) (Oral)   Resp 18   SpO2 98%   Visual Acuity Right Eye Distance:   Left Eye Distance:   Bilateral Distance:    Right Eye Near:   Left Eye Near:    Bilateral Near:     Physical Exam Vitals reviewed.  Constitutional:      General: She is not in acute distress.    Appearance: She is not ill-appearing, toxic-appearing or diaphoretic.  HENT:     Mouth/Throat:     Mouth: Mucous membranes are moist.  Eyes:     Extraocular Movements: Extraocular movements intact.     Conjunctiva/sclera: Conjunctivae normal.     Pupils: Pupils are equal, round, and reactive to light.  Cardiovascular:     Rate and Rhythm: Normal rate and regular rhythm.     Heart sounds: No murmur heard. Pulmonary:     Effort: Pulmonary effort is normal.     Breath sounds: Normal breath sounds.  Musculoskeletal:     Cervical back: Neck supple.     Comments: I cannot discern any edema in either leg.  There is no erythema and there is no calf tenderness.  There is no Homans' sign  Skin:    Capillary Refill: Capillary refill takes less than 2 seconds.     Coloration: Skin is not jaundiced or pale.  Neurological:     General: No focal deficit present.     Mental Status: She is alert and oriented to person,  place, and time.  Psychiatric:        Behavior: Behavior normal.      UC Treatments / Results  Labs (all labs ordered are listed, but only abnormal results are displayed) Labs Reviewed - No data to display  EKG   Radiology No results found.  Procedures Procedures (including critical care time)  Medications Ordered in UC Medications  ketorolac (TORADOL) 15 MG/ML injection 30 mg (has no administration in time range)    Initial Impression / Assessment and Plan / UC Course  I have reviewed the triage vital signs and the nursing notes.  Pertinent labs & imaging results that were available during my care of the patient were reviewed by me and considered in my medical decision making (see chart for details).        Due to her concerns venous Doppler is ordered, but I think there is a low likelihood of DVT for herwith her presentation.  Injection of Toradol is given here today and a steroid Dosepak is sent in.  Also gabapentin is sent in since this seems to be neuropathic in nature.  Have asked her to follow-up with your primary care  Final diagnoses:  Right leg pain  Right leg swelling     Discharge Instructions      You have been given a shot of Toradol 30 mg today.  Take the Medrol Dosepak as instructed in the package.  Gabapentin 100 mg--take 1 capsule 2 times daily.  This is for nerve pain.  Follow-up with your primary care about this issue     ED Prescriptions     Medication Sig Dispense Auth. Provider   methylPREDNISolone (MEDROL DOSEPAK) 4 MG TBPK tablet Take as per package instructions 1 each Zenia Resides, MD   gabapentin (NEURONTIN) 100 MG capsule Take 1 capsule (100 mg total) by mouth 2 (two) times daily. 30 capsule [address]  K, MD      PDMP not reviewed this encounter.   Zenia Resides, MD 02/27/23 [phone]

## 2023-02-27 NOTE — ED Triage Notes (Signed)
Patient with c/o right leg pain radiating down to her foot. States it kind of feels like it does when your foot goes to sleep but is also painful.

## 2023-02-27 NOTE — Discharge Instructions (Signed)
You have been given a shot of Toradol 30 mg today.  Take the Medrol Dosepak as instructed in the package.  Gabapentin 100 mg--take 1 capsule 2 times daily.  This is for nerve pain.  Follow-up with your primary care about this issue

## 2023-02-28 ENCOUNTER — Ambulatory Visit (HOSPITAL_COMMUNITY): Payer: Medicaid Other | Attending: Family Medicine

## 2023-03-19 ENCOUNTER — Encounter: Payer: Self-pay | Admitting: Certified Nurse Midwife

## 2023-03-19 ENCOUNTER — Other Ambulatory Visit: Payer: Self-pay

## 2023-03-19 ENCOUNTER — Ambulatory Visit (INDEPENDENT_AMBULATORY_CARE_PROVIDER_SITE_OTHER): Payer: Medicaid Other

## 2023-03-19 ENCOUNTER — Other Ambulatory Visit (HOSPITAL_COMMUNITY)
Admission: RE | Admit: 2023-03-19 | Discharge: 2023-03-19 | Disposition: A | Discharge status: Home / Self Care | Payer: Medicaid Other | Source: Ambulatory Visit | Attending: Obstetrics and Gynecology | Admitting: Obstetrics and Gynecology

## 2023-03-19 VITALS — BP 115/82 | HR 72 | Ht 64.0 in | Wt 272.0 lb

## 2023-03-19 DIAGNOSIS — Z202 Contact with and (suspected) exposure to infections with a predominantly sexual mode of transmission: Secondary | ICD-10-CM | POA: Insufficient documentation

## 2023-03-19 NOTE — Progress Notes (Signed)
Katie Ross is here with concern of STD exposure. No changes in discharge nor vaginal itching--however patient does report "fishy smell" from vaginal area.  Pertinent history: Patient had unprotected intercourse with two different partners (one about 2 weeks ago and one about 3 days ago). Hx of chlamydia, trich, gonorrhea, and HPV.   Plan of care: Self swab instructions given and specimen obtained. Patient requested STD/STI bloodwork labs as well; labs (HIV, RPR, Hep B, and Hep C) drawn. Patient denied UPT today. Explained patient will be contacted with any abnormal results. Patient is due for annual; has one scheduled in June 2024.   Meryl Crutch, RN 03/19/2023  10:56 AM

## 2023-03-20 LAB — CERVICOVAGINAL ANCILLARY ONLY
Bacterial Vaginitis (gardnerella): POSITIVE — AB
Candida Glabrata: NEGATIVE
Candida Vaginitis: NEGATIVE
Chlamydia: NEGATIVE
Comment: NEGATIVE
Comment: NEGATIVE
Comment: NEGATIVE
Comment: NEGATIVE
Comment: NEGATIVE
Comment: NORMAL
Neisseria Gonorrhea: NEGATIVE
Trichomonas: NEGATIVE

## 2023-03-20 LAB — HEPATITIS C ANTIBODY: Hep C Virus Ab: NONREACTIVE

## 2023-03-20 LAB — RPR: RPR Ser Ql: NONREACTIVE

## 2023-03-20 LAB — HIV ANTIBODY (ROUTINE TESTING W REFLEX): HIV Screen 4th Generation wRfx: NONREACTIVE

## 2023-03-20 LAB — HEPATITIS B SURFACE ANTIGEN: Hepatitis B Surface Ag: NEGATIVE

## 2023-03-24 ENCOUNTER — Telehealth: Payer: Self-pay | Admitting: Lactation Services

## 2023-03-24 MED ORDER — METRONIDAZOLE 500 MG PO TABS: 500.0000 mg | ORAL_TABLET | Freq: Two times a day (BID) | ORAL | 0 refills | Status: DC

## 2023-03-24 NOTE — Telephone Encounter (Signed)
Called and spoke with patient to give her results. She reports that she saw her results. She reports she has had BV previously and has taken Flagyl. Flagyl sent to Pharmacy. Reviewed no alcohol while taking. Patient voiced understanding to above.

## 2023-03-24 NOTE — Telephone Encounter (Signed)
-----   Message from Hermina Staggers, MD sent at 03/20/2023  5:28 PM EDT ----- Please let pt know that her blood STD tess were negative Vaginal swab positive for BV Send in Rx for Tx as per protocol.  Thanks Casimiro Needle

## 2023-04-29 NOTE — Progress Notes (Signed)
Visit canceled

## 2023-04-30 ENCOUNTER — Ambulatory Visit: Payer: Medicaid Other | Admitting: Certified Nurse Midwife

## 2023-04-30 DIAGNOSIS — Z975 Presence of (intrauterine) contraceptive device: Secondary | ICD-10-CM

## 2023-04-30 DIAGNOSIS — Z01419 Encounter for gynecological examination (general) (routine) without abnormal findings: Secondary | ICD-10-CM

## 2023-07-01 ENCOUNTER — Ambulatory Visit (INDEPENDENT_AMBULATORY_CARE_PROVIDER_SITE_OTHER): Payer: MEDICAID | Admitting: *Deleted

## 2023-07-01 ENCOUNTER — Other Ambulatory Visit: Payer: Self-pay

## 2023-07-01 ENCOUNTER — Other Ambulatory Visit (HOSPITAL_COMMUNITY)
Admission: RE | Admit: 2023-07-01 | Discharge: 2023-07-01 | Disposition: A | Discharge status: Home / Self Care | Payer: MEDICAID | Source: Ambulatory Visit | Attending: Family Medicine | Admitting: Family Medicine

## 2023-07-01 VITALS — BP 147/99 | HR 72 | Ht 64.0 in | Wt 277.9 lb

## 2023-07-01 DIAGNOSIS — N898 Other specified noninflammatory disorders of vagina: Secondary | ICD-10-CM

## 2023-07-01 NOTE — Progress Notes (Signed)
Here for self swab for c/o funky smell for a few weeks and brownish vaginal discharge. States she has had bv before. Wants to do full wet prep because she had unprotected intercourse. Self swab obtained. Notified will be notified if anything positive and she needs treatment.  BP elevated at 145/104, waited 5 minutes and repeated at 147 /99 . She does report she has high blood pressure in past and took meds in the past.  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  Advised to follow up with PCP asap. She does report occasional headaches relieved by tylenol.  Nancy Fetter

## 2023-07-02 LAB — CERVICOVAGINAL ANCILLARY ONLY
Bacterial Vaginitis (gardnerella): POSITIVE — AB
Candida Glabrata: NEGATIVE
Candida Vaginitis: NEGATIVE
Chlamydia: NEGATIVE
Comment: NEGATIVE
Comment: NEGATIVE
Comment: NEGATIVE
Comment: NEGATIVE
Comment: NEGATIVE
Comment: NORMAL
Neisseria Gonorrhea: NEGATIVE
Trichomonas: NEGATIVE

## 2023-07-03 ENCOUNTER — Other Ambulatory Visit: Payer: Self-pay

## 2023-07-03 MED ORDER — METRONIDAZOLE 500 MG PO TABS: 500.0000 mg | ORAL_TABLET | Freq: Two times a day (BID) | ORAL | 0 refills | Status: DC

## 2023-07-03 NOTE — Telephone Encounter (Signed)
Received lab results showing positive BV. Pt E-prescribed Flagyl 500 BID for 7 days. Pt called and notified of results. All questions answered.

## 2023-07-04 ENCOUNTER — Other Ambulatory Visit: Payer: Self-pay | Admitting: Certified Nurse Midwife

## 2023-07-22 IMAGING — CR DG THORACIC SPINE 3V
3 series · 3 of 3 positions shown · non-contrast
Comparison: None Available.

CLINICAL DATA: Back pain

EXAM:
THORACIC SPINE - 3 VIEWS

[w thoracic spine ap]
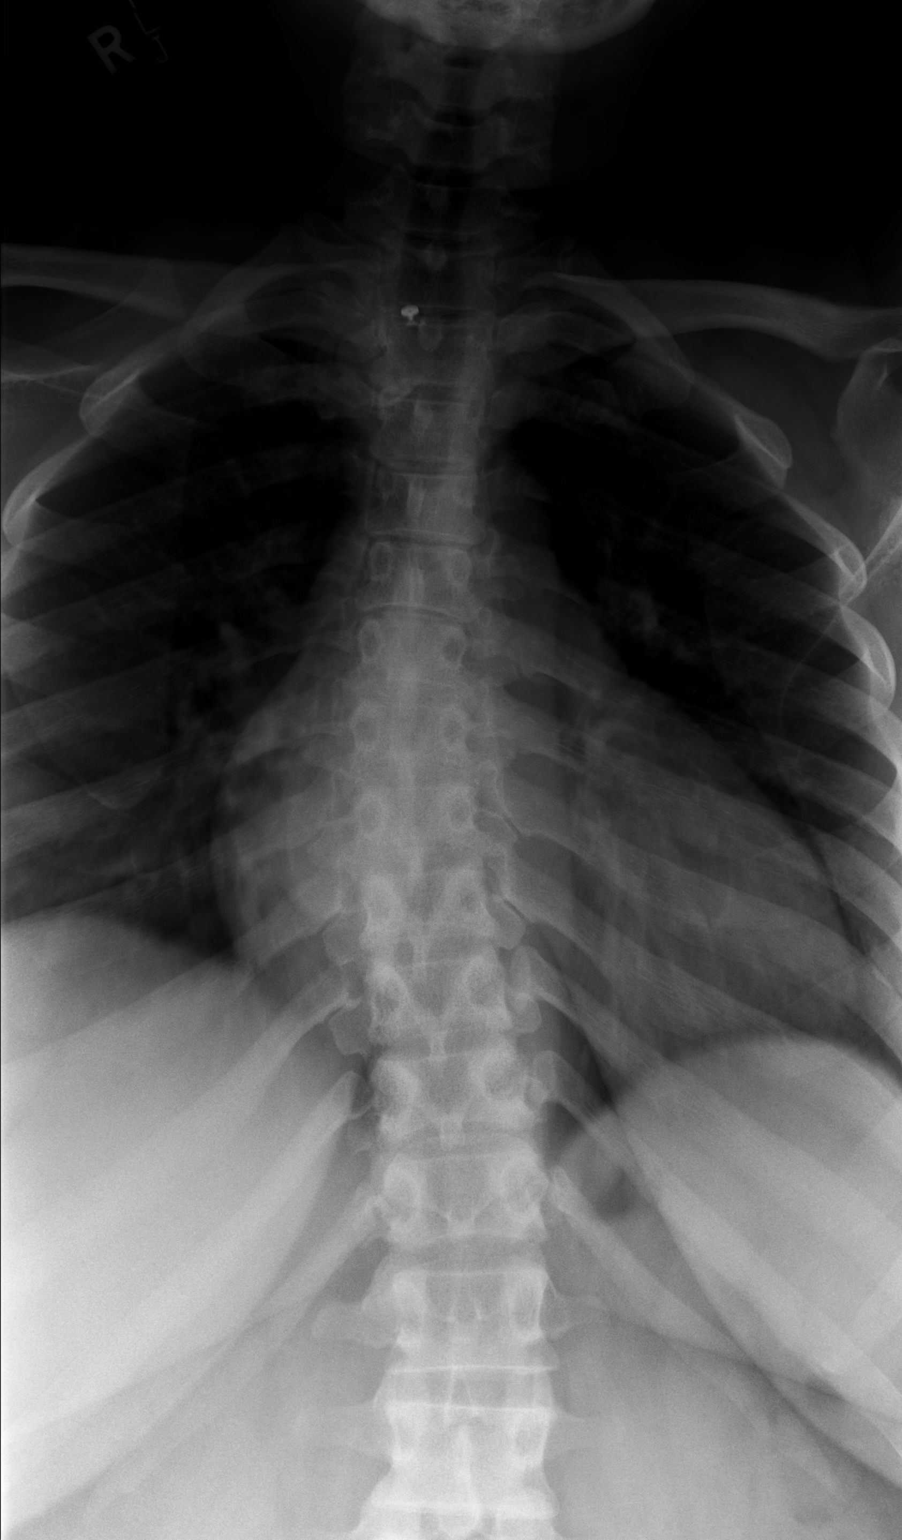

[w thoracic spine lat]
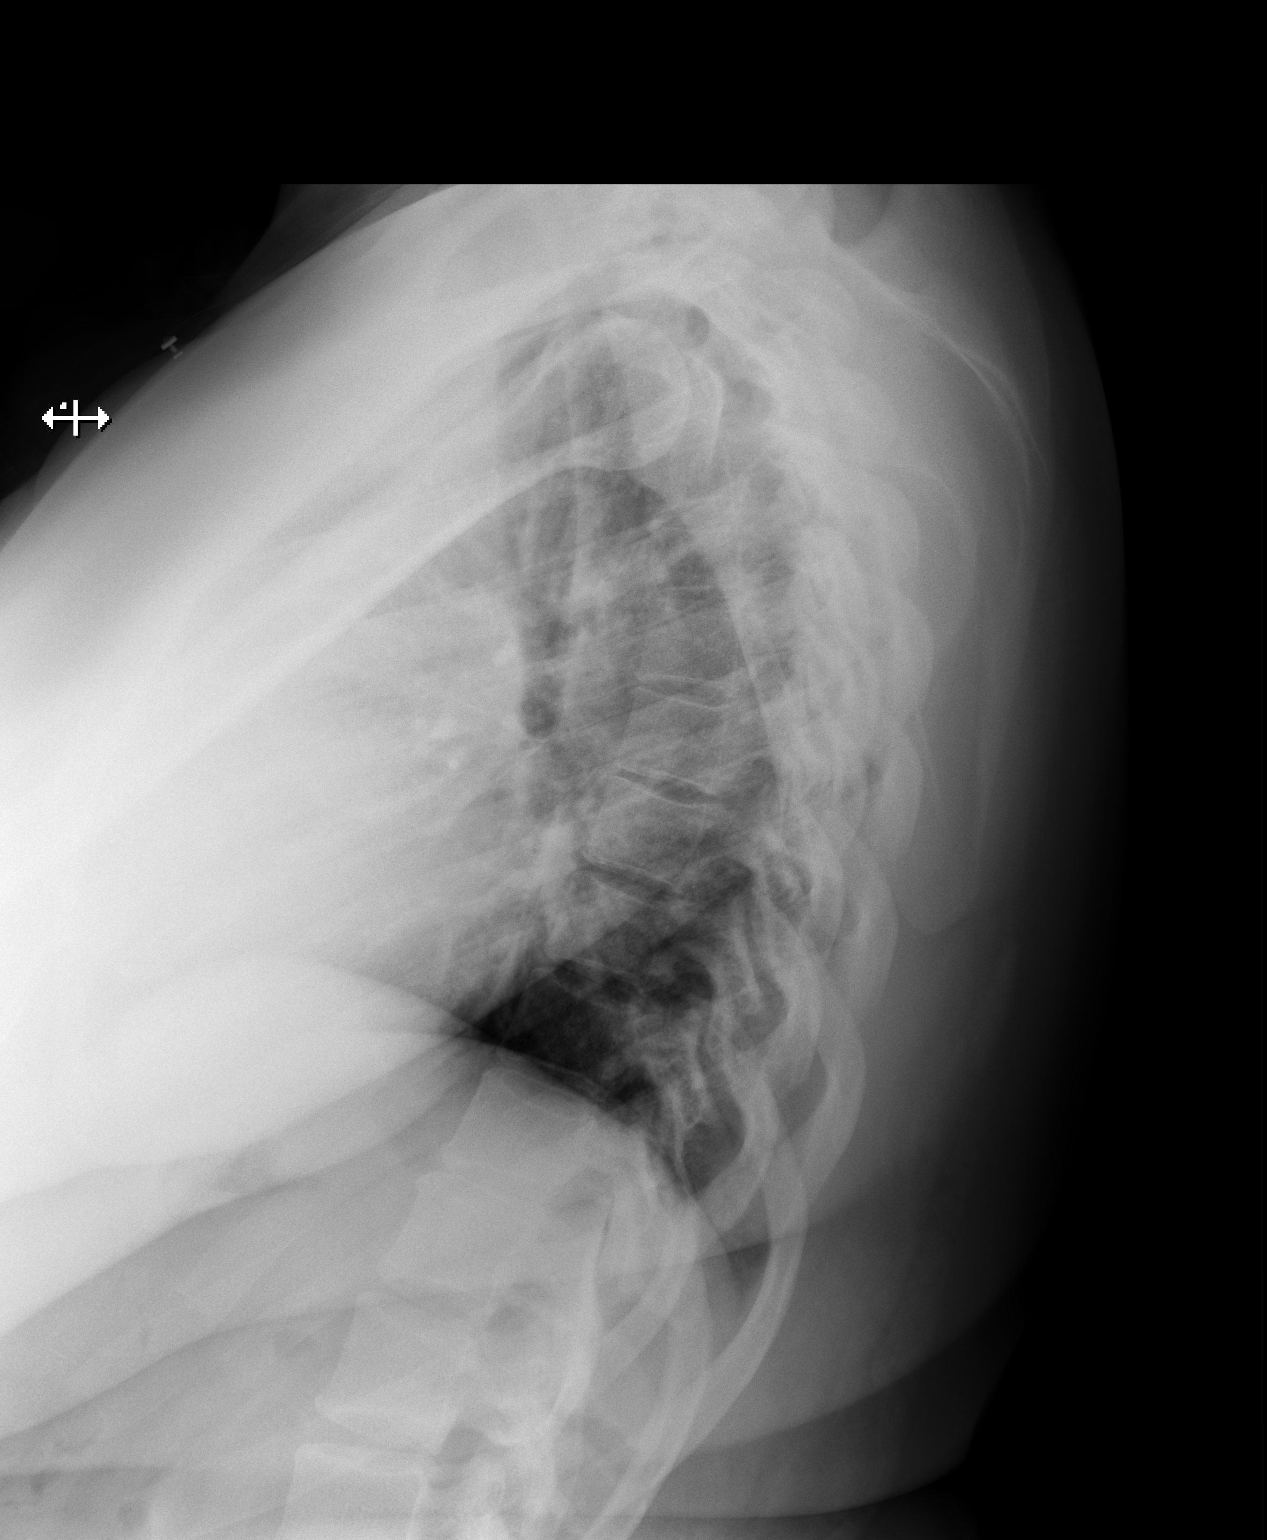

[w thoracic swimmers]
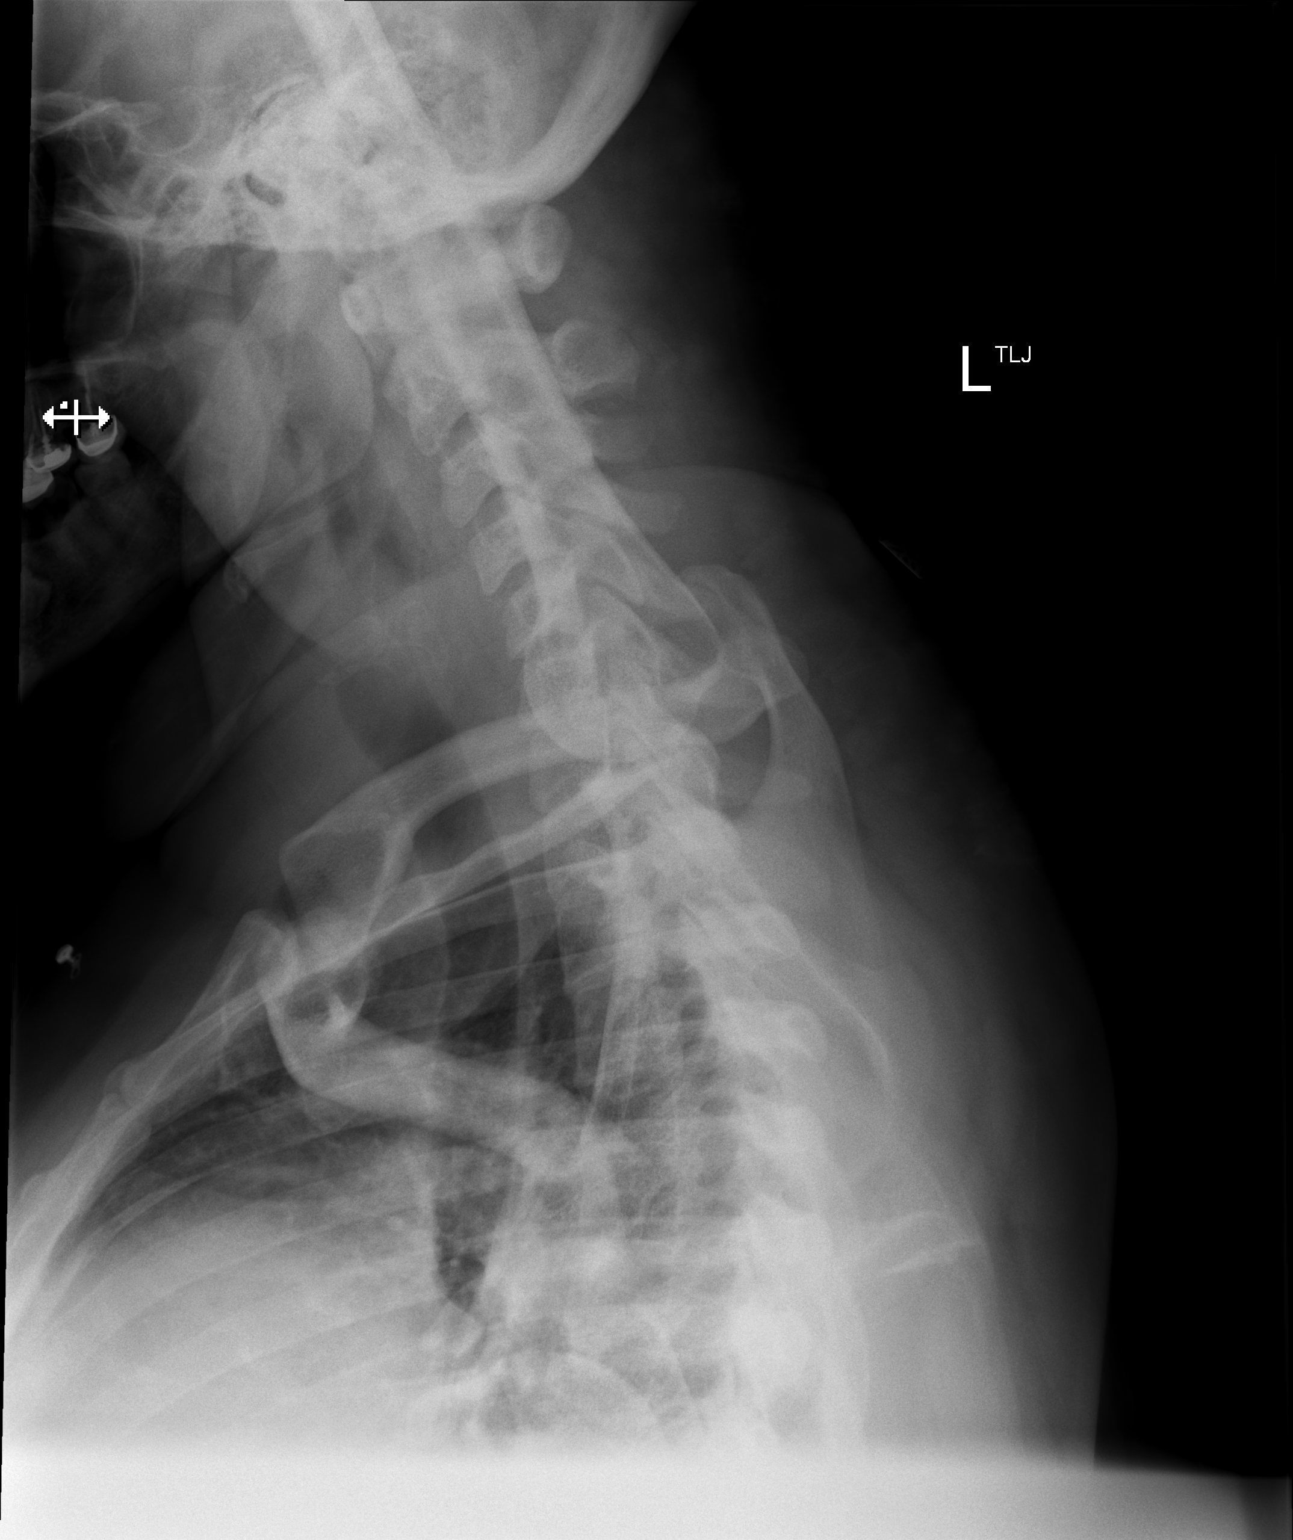

[3 of 3 positions shown; findings below may reference images not displayed]

FINDINGS: Mild dextrocurvature of the thoracic spine. Thoracic vertebral body
height and alignment are otherwise preserved without fracture or
subluxation. Intervertebral disc spaces are preserved.
IMPRESSION: No acute fracture or subluxation identified.

## 2023-07-22 IMAGING — CR DG LUMBAR SPINE COMPLETE 4+V
5 series · 5 of 5 positions shown · non-contrast
Comparison: None Available.

CLINICAL DATA: Back pain

EXAM:
LUMBAR SPINE - COMPLETE 4+ VIEW

[w lumbar spine ap]
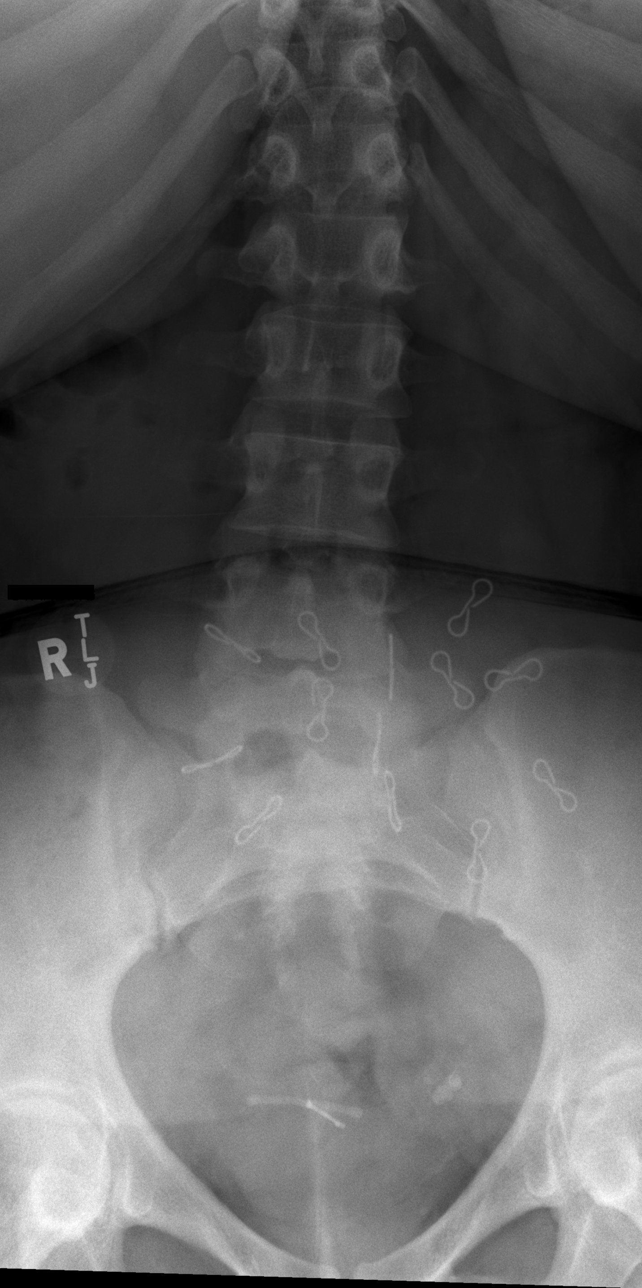

[w lumbar spine obl (1 of 2)]
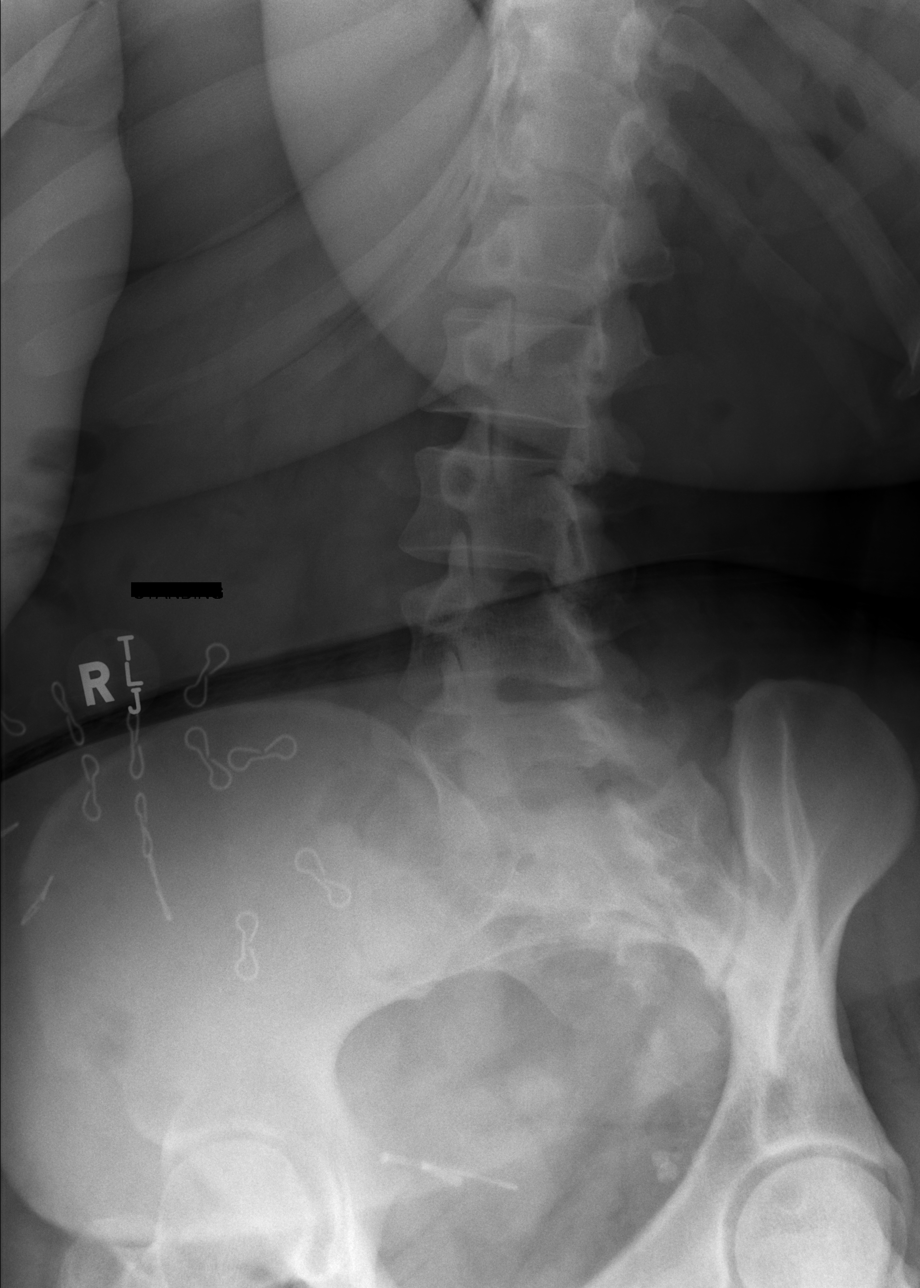

[w lumbar spine obl (2 of 2)]
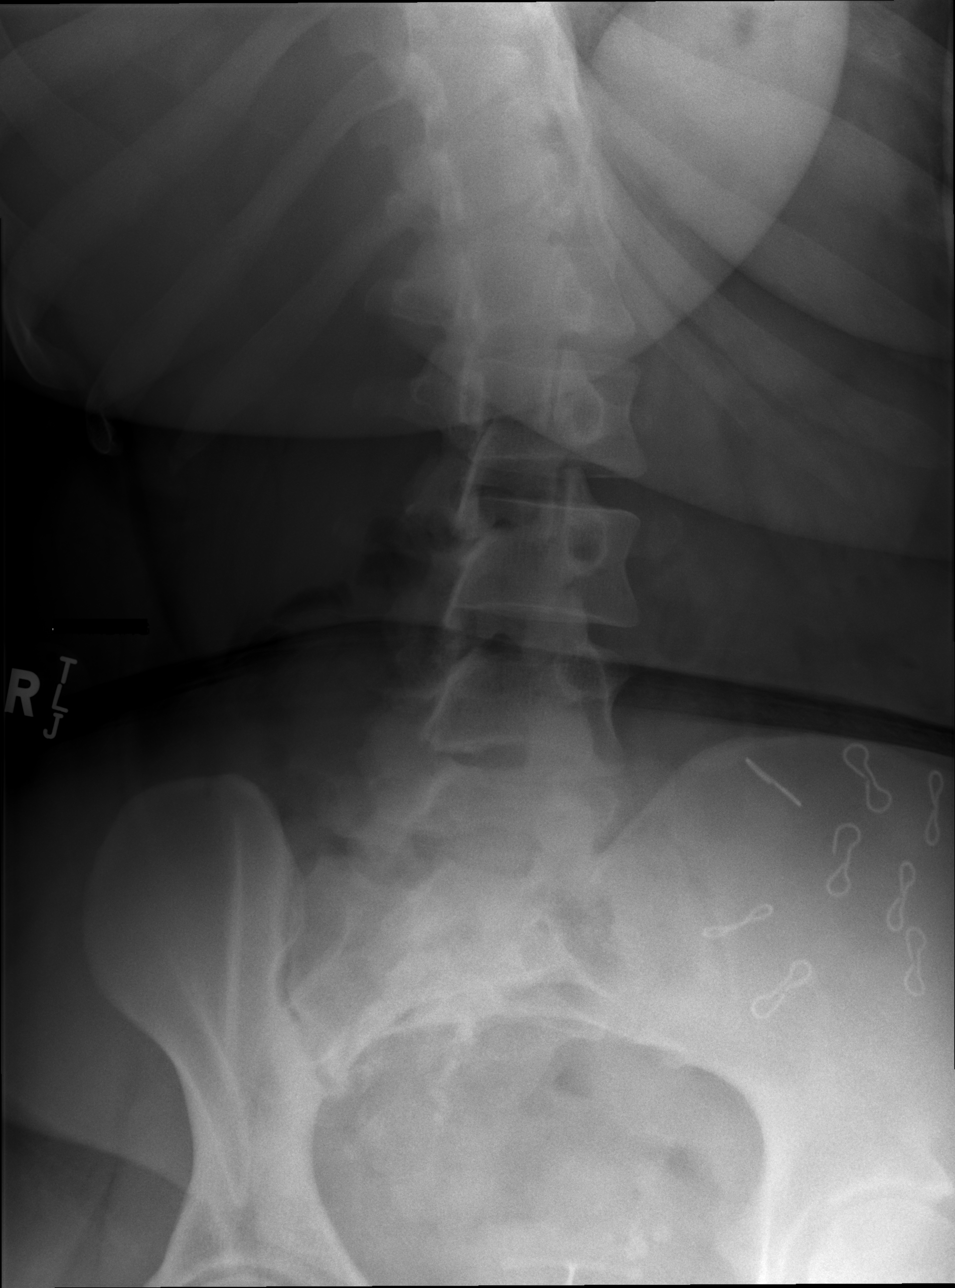

[w lumbar spine lat]
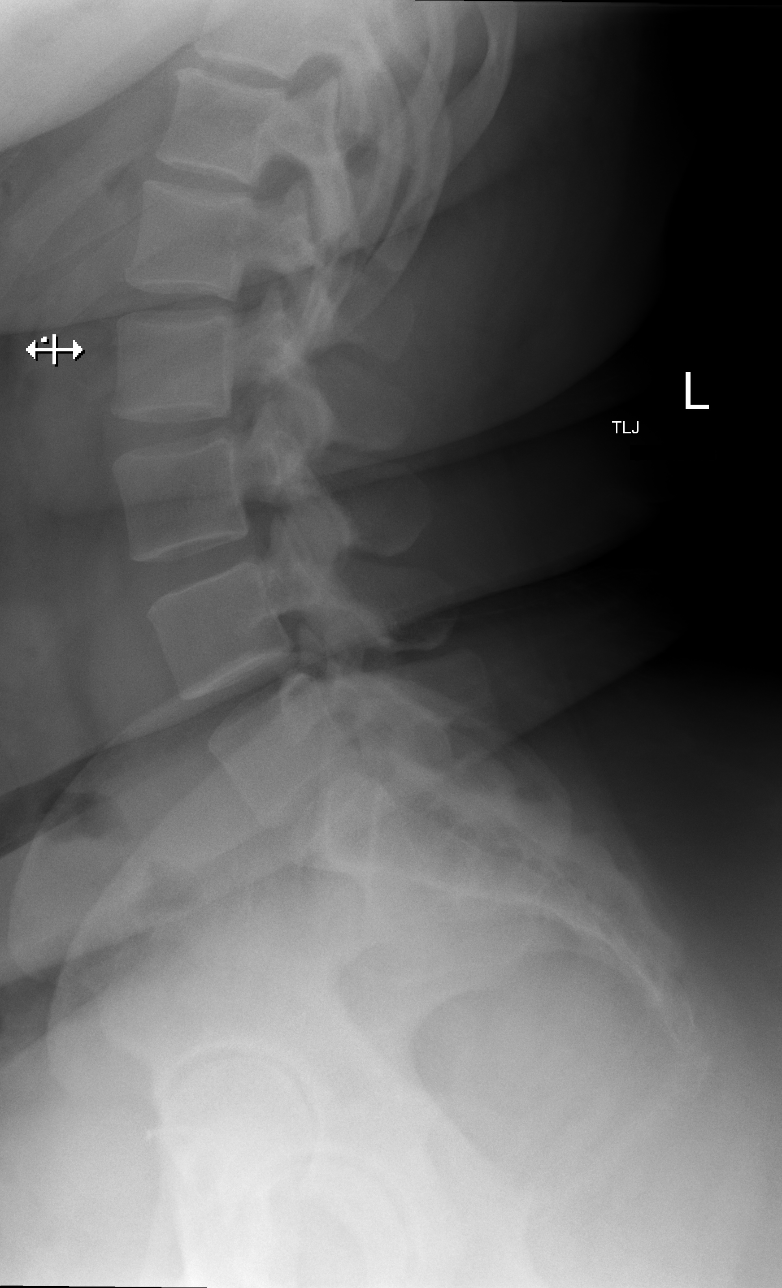

[w lumbar l-5 s-1 spot]
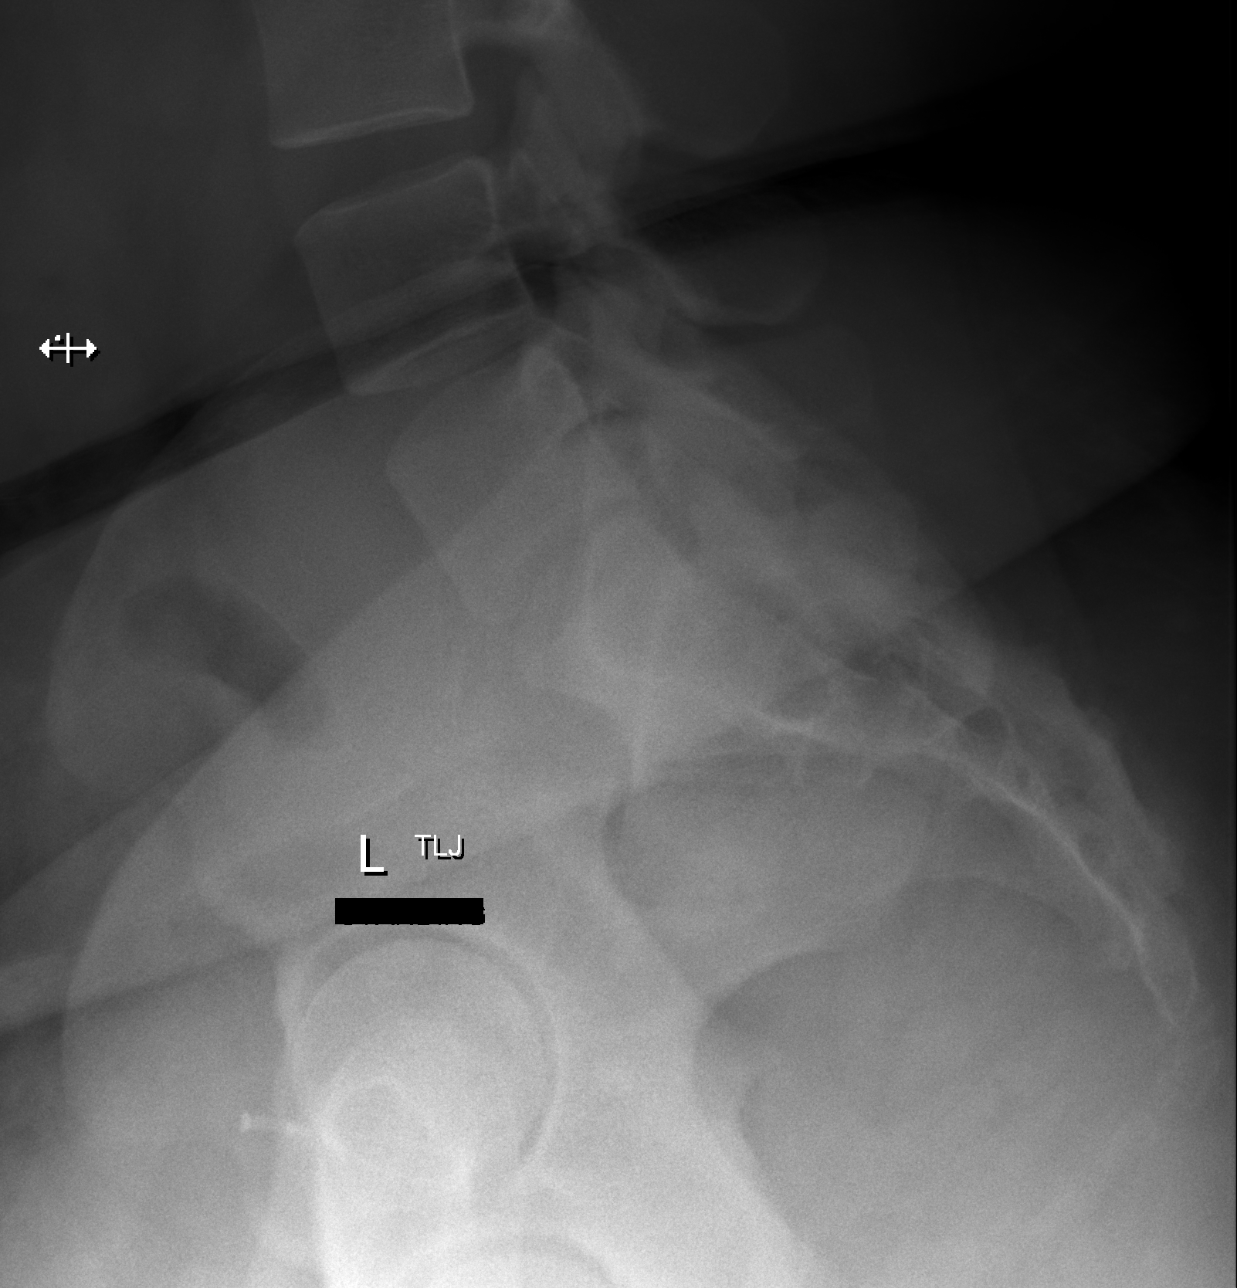

[5 of 5 positions shown; findings below may reference images not displayed]

FINDINGS: Lumbar vertebral body height and alignment are preserved without
fracture or spondylolisthesis. No spondylolysis identified. Mild
levocurvature of the thoracolumbar junction. Intervertebral disc
spaces are preserved.
IMPRESSION: No acute fracture or malalignment identified.

## 2023-07-23 ENCOUNTER — Encounter: Payer: Self-pay | Admitting: Certified Nurse Midwife

## 2023-07-23 MED ORDER — FLUCONAZOLE 150 MG PO TABS: 150.0000 mg | ORAL_TABLET | Freq: Once | ORAL | 0 refills | Status: AC

## 2023-07-30 ENCOUNTER — Ambulatory Visit
Admission: EM | Admit: 2023-07-30 | Discharge: 2023-07-30 | Disposition: A | Discharge status: Home / Self Care | Payer: MEDICAID

## 2023-07-30 DIAGNOSIS — M5441 Lumbago with sciatica, right side: Secondary | ICD-10-CM | POA: Diagnosis not present

## 2023-07-30 MED ORDER — CYCLOBENZAPRINE HCL 10 MG PO TABS: 10.0000 mg | ORAL_TABLET | Freq: Two times a day (BID) | ORAL | 0 refills | Status: AC | PRN

## 2023-07-30 MED ORDER — PREDNISONE 20 MG PO TABS: 40.0000 mg | ORAL_TABLET | Freq: Every day | ORAL | 0 refills | Status: AC

## 2023-07-30 NOTE — ED Provider Notes (Signed)
[facility]    CSN: [id number] Arrival date & time: 07/30/23  0855      History   Chief Complaint Chief Complaint  Patient presents with   Back Pain    HPI Katie Ross is a 37 y.o. female.   Patient here today for right wrist and right lower back pain that radiates down her right leg at times.  Reports at times pain travel across low back.  She denies any new injury but has had back pain in the past and states this is a regular flare.  She states symptoms started 4 days ago.  She denies any numbness or tingling other than some in her feet which is at baseline.  She has not had any fever.  She does not report treatment for symptoms.  The history is provided by the patient.    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 Authorizing Provider  cetirizine (ZYRTEC) 10 MG tablet Take 1 tablet by mouth daily. 05/19/23 05/18/24 Yes [provider]  cyclobenzaprine (FLEXERIL) 10 MG tablet Take 1 tablet (10 mg total) by  mouth 2 (two) times daily as needed for muscle spasms. 07/30/23  Yes Tomi Bamberger, PA-C  fluticasone (FLONASE) 50 MCG/ACT nasal spray Place 1 spray into both nostrils daily. 05/19/23 05/18/24 Yes [provider]  hydrochlorothiazide (HYDRODIURIL) 12.5 MG tablet Take 1 tablet by mouth daily. 05/19/23 05/18/24 Yes [provider]  hydrOXYzine (ATARAX) 25 MG tablet Take 25 mg by mouth every 8 (eight) hours as needed for anxiety or itching. 07/04/23  Yes [provider]  levonorgestrel (MIRENA) 20 MCG/DAY IUD 1 each by Intrauterine route once.   Yes [provider]  metroNIDAZOLE (FLAGYL) 500 MG tablet Take 1 tablet (500 mg total) by mouth 2 (two) times daily. 03/24/23   Hermina Staggers, MD  predniSONE (DELTASONE) 20 MG tablet Take 2 tablets (40 mg total) by mouth daily with breakfast for 5 days. 07/30/23 08/04/23 Yes Tomi Bamberger, PA-C  topiramate (TOPAMAX) 100 MG tablet Take 100 mg by mouth daily. 07/04/23  Yes [provider]  VRAYLAR 1.5 MG capsule Take 1.5 mg by mouth daily. 12/27/22  Yes [provider]  gabapentin (NEURONTIN) 100 MG capsule  Take 1 capsule (100 mg total) by mouth 2 (two) times daily. 02/27/23   Zenia Resides, MD  metroNIDAZOLE (FLAGYL) 500 MG tablet Take 1 tablet (500 mg total) by mouth 2 (two) times daily. 07/03/23   [place] Bing, MD    Family History History reviewed. No pertinent family history.  Social History Social History   Tobacco Use   Smoking status: Every Day    Current packs/day: 0.25    Types: Cigars, Cigarettes   Smokeless tobacco: Never  Vaping Use   Vaping status: Never Used  Substance Use Topics   Alcohol use: Yes    Comment: socially   Drug use: No     Allergies   Sulfamethoxazole   Review of Systems Review of Systems  Constitutional:  Negative for chills and fever.  Eyes:  Negative for discharge and redness.  Gastrointestinal:  Negative for abdominal pain, nausea and vomiting.   Musculoskeletal:  Positive for back pain and myalgias.     Physical Exam Triage Vital Signs ED Triage Vitals  Encounter Vitals Group     BP      Systolic BP Percentile      Diastolic BP Percentile      Pulse      Resp      Temp      Temp src      SpO2      Weight      Height      Head Circumference      Peak Flow      Pain Score      Pain Loc      Pain Education      Exclude from Growth Chart    No data found.  Updated Vital Signs BP 116/76 (BP Location: Left Arm)   Pulse 70   Temp 98.7 F (37.1 C) (Oral)   Resp 20   Ht 5\' 4"  (1.626 m)   Wt 277 lb (125.6 kg)   LMP  (LMP Unknown)   SpO2 98%   BMI 47.55 kg/m      Physical Exam Vitals and nursing note reviewed.  Constitutional:      General: She is not in acute distress.    Appearance: Normal appearance. She is not ill-appearing.  HENT:     Head: Normocephalic and atraumatic.  Eyes:     Conjunctiva/sclera: Conjunctivae normal.  Cardiovascular:     Rate and Rhythm: Normal rate.  Pulmonary:     Effort: Pulmonary effort is normal. No respiratory distress.  Musculoskeletal:     Comments: Mild tenderness to palpation across low back.  Worse to right than left.  Neurological:     Mental Status: She is alert.  Psychiatric:        Mood and Affect: Mood normal.        Behavior: Behavior normal.        Thought Content: Thought content normal.      UC Treatments / Results  Labs (all labs ordered are listed, but only abnormal results are displayed) Labs Reviewed - No data to display  EKG   Radiology No results found.  Procedures Procedures (including critical care time)  Medications Ordered in UC Medications - No data to display  Initial Impression / Assessment and Plan / UC Course  I have reviewed the triage vital signs and the nursing notes.  Pertinent labs & imaging results that were available during my care of the patient were reviewed by me and considered in my medical decision  making  (see chart for details).    Steroid burst and muscle relaxer prescribed for suspected flare of back pain with possible sciatica.  Recommended follow-up if no gradual improvement with any further concerns.  Advised slow controlled stretching, heat application to help prevent further muscular damage and irritation.  Final Clinical Impressions(s) / UC Diagnoses   Final diagnoses:  Acute right-sided low back pain with right-sided sciatica   Discharge Instructions   None    ED Prescriptions     Medication Sig Dispense Auth. Provider   predniSONE (DELTASONE) 20 MG tablet Take 2 tablets (40 mg total) by mouth daily with breakfast for 5 days. 10 tablet Erma Pinto F, PA-C   cyclobenzaprine (FLEXERIL) 10 MG tablet Take 1 tablet (10 mg total) by mouth 2 (two) times daily as needed for muscle spasms. 20 tablet Tomi Bamberger, PA-C      PDMP not reviewed this encounter.   Tomi Bamberger, PA-C 07/30/23 1114

## 2023-07-30 NOTE — ED Triage Notes (Signed)
"  I am having right lower back, sciatic pain". No new injury since last diagnosed with this chronic problem. "This flare up of pain started about 4 days ago".

## 2023-12-16 ENCOUNTER — Other Ambulatory Visit: Payer: Self-pay

## 2023-12-16 ENCOUNTER — Ambulatory Visit: Payer: MEDICAID

## 2023-12-16 ENCOUNTER — Other Ambulatory Visit (HOSPITAL_COMMUNITY)
Admission: RE | Admit: 2023-12-16 | Discharge: 2023-12-16 | Disposition: A | Discharge status: Home / Self Care | Payer: MEDICAID | Source: Ambulatory Visit | Attending: Family Medicine | Admitting: Family Medicine

## 2023-12-16 VITALS — BP 130/88 | HR 77 | Ht 64.0 in | Wt 275.0 lb

## 2023-12-16 DIAGNOSIS — Z113 Encounter for screening for infections with a predominantly sexual mode of transmission: Secondary | ICD-10-CM | POA: Insufficient documentation

## 2023-12-16 DIAGNOSIS — N898 Other specified noninflammatory disorders of vagina: Secondary | ICD-10-CM

## 2023-12-16 NOTE — Progress Notes (Signed)
 Katie Ross is here with concerns of vaginal odor and had unprotected intercourse with two different partners and request to do a vaginal swab as well as blood test for STI, HIV, Hep B, Hep C and RPR. Patient reports vaginal odor symptom have been present for 1 week and voiced concern for possible BV. Patient is due for an annual with pap smear and has an appointment scheduled for 01/19/23 at 1:35 PM. Patient confirmed scheduled appointment.   Self swab instructions given and specimen obtained. Explained patient will be contacted with any abnormal results.  Rosaline Pendleton, RN 12/16/2023  2:50 PM

## 2023-12-17 ENCOUNTER — Encounter: Payer: Self-pay | Admitting: Family Medicine

## 2023-12-17 ENCOUNTER — Other Ambulatory Visit: Payer: Self-pay | Admitting: Family Medicine

## 2023-12-17 LAB — CERVICOVAGINAL ANCILLARY ONLY
Bacterial Vaginitis (gardnerella): POSITIVE — AB
Candida Glabrata: NEGATIVE
Candida Vaginitis: NEGATIVE
Chlamydia: NEGATIVE
Comment: NEGATIVE
Comment: NEGATIVE
Comment: NEGATIVE
Comment: NEGATIVE
Comment: NEGATIVE
Comment: NORMAL
Neisseria Gonorrhea: NEGATIVE
Trichomonas: NEGATIVE

## 2023-12-17 LAB — HIV ANTIBODY (ROUTINE TESTING W REFLEX): HIV Screen 4th Generation wRfx: NONREACTIVE

## 2023-12-17 LAB — HEPATITIS C ANTIBODY: Hep C Virus Ab: NONREACTIVE

## 2023-12-17 LAB — RPR: RPR Ser Ql: NONREACTIVE

## 2023-12-17 LAB — HEPATITIS B SURFACE ANTIGEN: Hepatitis B Surface Ag: NEGATIVE

## 2023-12-17 MED ORDER — METRONIDAZOLE 500 MG PO TABS: 500.0000 mg | ORAL_TABLET | Freq: Two times a day (BID) | ORAL | 0 refills | Status: AC

## 2024-01-18 NOTE — Progress Notes (Unsigned)
 ANNUAL EXAM Patient name: Katie Ross MRN [id number]  Date of birth: 30-Jun-1986 Chief Complaint:   No chief complaint on file.  History of Present Illness:   Katie Ross is a 38 y.o. G73P3003 {race:25618} female being seen today for a routine annual exam.  Current complaints: ***  No LMP recorded. (Menstrual status: IUD).   The pregnancy intention screening data noted above was reviewed. Potential methods of contraception were discussed. The patient elected to proceed with No data recorded.   Last pap 07/15/2019 NILM, HR HPV neg.  H/O abnormal pap: {yes/yes***/no:23866} Last mammogram: Not yet indicated due to age. Results were: N/A. Family h/o breast cancer: {yes***/no:23838} Last colonoscopy: Not yet indicated due to age. Results were: N/A. Family h/o colorectal cancer: {yes***/no:23838} STI screening:  Contraception: IUD inserted 10/19/2020 ***     02/07/2021    8:05 AM 07/26/2020    3:17 PM 05/05/2017   11:15 AM 07/04/2016    9:04 AM  Depression screen PHQ 2/9  Decreased Interest 1 1 1 2   Down, Depressed, Hopeless 1 1 1 3   PHQ - 2 Score 2 2 2 5   Altered sleeping 1 3 1 3   Tired, decreased energy 0 1 1 1   Change in appetite 1 2 1 2   Feeling bad or failure about yourself  1 0 1 2  Trouble concentrating 1 3 1  0  Moving slowly or fidgety/restless 0 0 0 0  Suicidal thoughts 0 0 0 0  PHQ-9 Score 6 11 7 13         02/07/2021    8:05 AM 07/26/2020    3:17 PM 05/05/2017   11:16 AM 07/04/2016    9:04 AM  GAD 7 : Generalized Anxiety Score  Nervous, Anxious, on Edge 0 0 1 3  Control/stop worrying 1 3 1 3   Worry too much - different things 1 3 1 3   Trouble relaxing 1 3 1 3   Restless 0 0 0 2  Easily annoyed or irritable 1 3 1 3   Afraid - awful might happen 1 2 1 2   Total GAD 7 Score 5 14 6 19      Review of Systems:   Pertinent items are noted in HPI Denies any headaches, blurred vision, fatigue, shortness of breath, chest pain, abdominal pain, abnormal vaginal  discharge/itching/odor/irritation, problems with periods, bowel movements, urination, or intercourse unless otherwise stated above. Pertinent History Reviewed:  Reviewed past medical,surgical, social and family history.  Reviewed problem list, medications and allergies. Physical Assessment:  There were no vitals filed for this visit.There is no height or weight on file to calculate BMI.        Physical Examination:   General appearance - well appearing, and in no distress  Mental status - alert, oriented to person, place, and time  Psych:  She has a normal mood and affect  Skin - warm and dry, normal color, no suspicious lesions noted  Chest - effort normal, all lung fields clear to auscultation bilaterally  Heart - normal rate and regular rhythm  Neck:  midline trachea, no thyromegaly or nodules  Breasts - breasts appear normal, no suspicious masses, no skin or nipple changes or  axillary nodes  Abdomen - soft, nontender, nondistended, no masses or organomegaly  Pelvic - VULVA: normal appearing vulva with no masses, tenderness or lesions  VAGINA: normal appearing vagina with normal color and discharge, no lesions  CERVIX: normal appearing cervix without discharge or lesions, no CMT  Thin prep pap is {Desc; done/not:10129} ***  HR HPV cotesting  UTERUS: uterus is felt to be normal size, shape, consistency and nontender   ADNEXA: No adnexal masses or tenderness noted.  Extremities:  No swelling or varicosities noted  Chaperone present for exam  No results found for this or any previous visit (from the past 24 hours).  Assessment & Plan:   1. Encounter for annual routine gynecological examination (Primary) 2. Cervical cancer screening ***  - Cervical cancer screening: Discussed guidelines. Pap with HPV *** - Gardasil: {Blank single:19197::"***","has not yet had. Will provide information","completed","has not yet had. Counseling provided and she declines","Has not yet had. Counseling  provided and pt accepts"} - GC/CT: {Blank single:19197::"accepts","declines","not indicated"} - Birth Control: {Birth control type:23956} - Breast Health: Encouraged self breast awareness/SBE. Teaching provided.  - Mammogram: {Mammo f/u:25212::"@ 40yo"}, or sooner if problems - Colonoscopy: {TCS f/u:25213::"@ 40yo"}, or sooner if problems - F/U 12 months and prn   No orders of the defined types were placed in this encounter.  Meds: No orders of the defined types were placed in this encounter.  Follow-up: No follow-ups on file.  Ralene Muskrat, [place] 01/18/2024 12:28 PM

## 2024-01-19 ENCOUNTER — Other Ambulatory Visit (HOSPITAL_COMMUNITY)
Admission: RE | Admit: 2024-01-19 | Discharge: 2024-01-19 | Disposition: A | Discharge status: Home / Self Care | Payer: MEDICAID | Source: Ambulatory Visit | Attending: Physician Assistant | Admitting: Physician Assistant

## 2024-01-19 ENCOUNTER — Ambulatory Visit: Payer: MEDICAID | Admitting: Physician Assistant

## 2024-01-19 ENCOUNTER — Other Ambulatory Visit: Payer: Self-pay

## 2024-01-19 VITALS — BP 143/84 | HR 66 | Ht 64.0 in | Wt 280.3 lb

## 2024-01-19 DIAGNOSIS — Z124 Encounter for screening for malignant neoplasm of cervix: Secondary | ICD-10-CM | POA: Diagnosis present

## 2024-01-19 DIAGNOSIS — Z01419 Encounter for gynecological examination (general) (routine) without abnormal findings: Secondary | ICD-10-CM | POA: Insufficient documentation

## 2024-01-19 DIAGNOSIS — N760 Acute vaginitis: Secondary | ICD-10-CM | POA: Insufficient documentation

## 2024-01-19 DIAGNOSIS — Z30431 Encounter for routine checking of intrauterine contraceptive device: Secondary | ICD-10-CM | POA: Diagnosis not present

## 2024-01-19 DIAGNOSIS — B9689 Other specified bacterial agents as the cause of diseases classified elsewhere: Secondary | ICD-10-CM | POA: Insufficient documentation

## 2024-01-19 DIAGNOSIS — I10 Essential (primary) hypertension: Secondary | ICD-10-CM

## 2024-01-19 DIAGNOSIS — Z1231 Encounter for screening mammogram for malignant neoplasm of breast: Secondary | ICD-10-CM | POA: Diagnosis not present

## 2024-01-19 DIAGNOSIS — Z113 Encounter for screening for infections with a predominantly sexual mode of transmission: Secondary | ICD-10-CM | POA: Insufficient documentation

## 2024-01-20 LAB — CERVICOVAGINAL ANCILLARY ONLY
Bacterial Vaginitis (gardnerella): POSITIVE — AB
Candida Glabrata: NEGATIVE
Candida Vaginitis: NEGATIVE
Chlamydia: NEGATIVE
Comment: NEGATIVE
Comment: NEGATIVE
Comment: NEGATIVE
Comment: NEGATIVE
Comment: NEGATIVE
Comment: NORMAL
Neisseria Gonorrhea: NEGATIVE
Trichomonas: NEGATIVE

## 2024-01-21 ENCOUNTER — Other Ambulatory Visit: Payer: Self-pay | Admitting: Physician Assistant

## 2024-01-21 DIAGNOSIS — B9689 Other specified bacterial agents as the cause of diseases classified elsewhere: Secondary | ICD-10-CM

## 2024-01-21 LAB — CYTOLOGY - PAP
Adequacy: ABSENT
Comment: NEGATIVE
Diagnosis: NEGATIVE
High risk HPV: NEGATIVE

## 2024-01-21 MED ORDER — METRONIDAZOLE 500 MG PO TABS: 500.0000 mg | ORAL_TABLET | Freq: Two times a day (BID) | ORAL | 0 refills | Status: AC

## 2024-01-22 ENCOUNTER — Ambulatory Visit (HOSPITAL_BASED_OUTPATIENT_CLINIC_OR_DEPARTMENT_OTHER): Payer: MEDICAID

## 2024-01-26 ENCOUNTER — Ambulatory Visit (HOSPITAL_BASED_OUTPATIENT_CLINIC_OR_DEPARTMENT_OTHER)
Admission: RE | Admit: 2024-01-26 | Discharge: 2024-01-26 | Disposition: A | Discharge status: Home / Self Care | Payer: MEDICAID | Source: Ambulatory Visit | Attending: Physician Assistant | Admitting: Physician Assistant

## 2024-01-26 DIAGNOSIS — Z30431 Encounter for routine checking of intrauterine contraceptive device: Secondary | ICD-10-CM | POA: Diagnosis present

## 2024-02-06 ENCOUNTER — Encounter: Payer: Self-pay | Admitting: Physician Assistant

## 2024-08-17 ENCOUNTER — Ambulatory Visit: Payer: MEDICAID | Admitting: Nurse Practitioner

## 2024-08-23 ENCOUNTER — Inpatient Hospital Stay: Admission: RE | Admit: 2024-08-23 | Payer: MEDICAID | Source: Ambulatory Visit

## 2024-09-15 ENCOUNTER — Ambulatory Visit
Admission: RE | Admit: 2024-09-15 | Discharge: 2024-09-15 | Disposition: A | Payer: MEDICAID | Source: Ambulatory Visit | Attending: Physician Assistant

## 2024-09-15 DIAGNOSIS — Z1231 Encounter for screening mammogram for malignant neoplasm of breast: Secondary | ICD-10-CM

## 2024-09-15 DIAGNOSIS — Z01419 Encounter for gynecological examination (general) (routine) without abnormal findings: Secondary | ICD-10-CM

## 2024-12-07 ENCOUNTER — Telehealth: Payer: MEDICAID | Admitting: Nurse Practitioner

## 2024-12-07 DIAGNOSIS — F3181 Bipolar II disorder: Secondary | ICD-10-CM

## 2024-12-07 DIAGNOSIS — F419 Anxiety disorder, unspecified: Secondary | ICD-10-CM

## 2024-12-07 MED ORDER — CARIPRAZINE HCL 1.5 MG PO CAPS: 1.5000 mg | ORAL_CAPSULE | Freq: Every day | ORAL | 0 refills | Status: AC

## 2024-12-07 MED ORDER — FLUOXETINE HCL 20 MG PO CAPS: 20.0000 mg | ORAL_CAPSULE | Freq: Every day | ORAL | 0 refills | Status: AC

## 2024-12-07 NOTE — Progress Notes (Unsigned)
" ° °  Subjective:  No chief complaint on file.    HPI: Katie Ross is a 39 y.o. 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  ROS: Negative unless specifically indicated above in HPI.   Relevant past medical history reviewed and updated as indicated.   Allergies and medications reviewed and updated.   Current Outpatient Medications  Medication Instructions   cetirizine (ZYRTEC) 10 MG tablet 1 tablet, Daily   clotrimazole-betamethasone (LOTRISONE) cream 1 Application, 2 times daily   cyclobenzaprine  (FLEXERIL ) 10 mg, Oral, 2 times daily PRN   fluticasone (FLONASE) 50 MCG/ACT nasal spray 1 spray, Daily   gabapentin  (NEURONTIN ) 100 mg, Oral, 2 times daily   hydrochlorothiazide (HYDRODIURIL) 12.5 MG tablet 1 tablet, Daily   hydrOXYzine (ATARAX) 25 mg, Every 8 hours PRN   levonorgestrel  (MIRENA ) 20 MCG/DAY IUD 1 each,  Once   topiramate (TOPAMAX) 100 mg, Daily   Vraylar  1.5 mg, Daily     Allergies[1]  Objective:   There were no vitals taken for this visit.   Physical Exam Constitutional:      Appearance: Normal appearance.  HENT:     Head: Normocephalic.  Eyes:     Conjunctiva/sclera: Conjunctivae normal.  Cardiovascular:     Rate and Rhythm: Normal rate and regular rhythm.  Pulmonary:     Effort: Pulmonary effort is normal.     Breath sounds: Normal breath sounds.  Skin:    General: Skin is warm and dry.  Neurological:     General: No focal deficit present.     Mental Status: She is alert and oriented to person, place, and time.  Psychiatric:        Mood and Affect: Mood normal.        Behavior: Behavior normal.        Thought Content: Thought content normal.        Judgment: Judgment normal.     Eye Contact:  {BHH EYE CONTACT:22684}  Speech:  {Speech:22685}  Volume:  {Volume (PAA):22686}  Mood:  {BHH MOOD:22306}  Affect:  {Affect  (PAA):22687}  Thought Process:  {Thought Process (PAA):22688}  Orientation:  {BHH ORIENTATION (PAA):22689}  Thought Content:  {Thought Content:22690}  Suicidal Thoughts:  {ST/HT (PAA):22692}  Homicidal Thoughts:  {ST/HT (PAA):22692}  Memory:  {BHH MEMORY:22881}  Judgement:  {Judgement (PAA):22694}  Insight:  {Insight (PAA):22695}  Psychomotor Activity:  {Psychomotor (PAA):22696}  Concentration:  {Concentration:21399}  Recall:  {BHH GOOD/FAIR/POOR:22877}  Fund of Knowledge:  {BHH GOOD/FAIR/POOR:22877}  Language:  {BHH GOOD/FAIR/POOR:22877}  Akathisia:  {BHH YES OR NO:22294}  Handed:  {Handed:22697}  AIMS (if indicated):     Assets:  {Assets (PAA):22698}  ADL's:  {BHH JIO'D:77709}  Cognition:  {chl bhh cognition:304700322}  Sleep:        Assessment & Plan:   Assessment & Plan Bipolar 2 disorder (HCC)     Anxiety        Follow up plan: No follow-ups on file.  Florencia Cousin, NP       [1] No Known Allergies "

## 2025-01-04 ENCOUNTER — Telehealth: Payer: MEDICAID | Admitting: Nurse Practitioner
